# Patient Record
Sex: Female | Born: 1960 | Race: White | Hispanic: No | Marital: Married | Smoking: Never smoker
Health system: Southern US, Community
[De-identification: ages and names within clinical notes are randomized; demographics above are authoritative.]

## PROBLEM LIST (undated history)

## (undated) DIAGNOSIS — E669 Obesity, unspecified: Secondary | ICD-10-CM

## (undated) DIAGNOSIS — R5381 Other malaise: Secondary | ICD-10-CM

## (undated) DIAGNOSIS — J209 Acute bronchitis, unspecified: Secondary | ICD-10-CM

## (undated) DIAGNOSIS — E663 Overweight: Secondary | ICD-10-CM

## (undated) DIAGNOSIS — K5900 Constipation, unspecified: Secondary | ICD-10-CM

## (undated) DIAGNOSIS — N9089 Other specified noninflammatory disorders of vulva and perineum: Secondary | ICD-10-CM

## (undated) DIAGNOSIS — M2550 Pain in unspecified joint: Secondary | ICD-10-CM

## (undated) DIAGNOSIS — R7303 Prediabetes: Secondary | ICD-10-CM

## (undated) DIAGNOSIS — B001 Herpesviral vesicular dermatitis: Secondary | ICD-10-CM

## (undated) DIAGNOSIS — N3000 Acute cystitis without hematuria: Secondary | ICD-10-CM

## (undated) DIAGNOSIS — K2970 Gastritis, unspecified, without bleeding: Secondary | ICD-10-CM

## (undated) DIAGNOSIS — F419 Anxiety disorder, unspecified: Secondary | ICD-10-CM

## (undated) DIAGNOSIS — Z6830 Body mass index (BMI) 30.0-30.9, adult: Secondary | ICD-10-CM

## (undated) HISTORY — DX: Other fatigue: R53.81

## (undated) HISTORY — DX: Other specified noninflammatory disorders of vulva and perineum: N90.89

## (undated) HISTORY — DX: Herpesviral vesicular dermatitis: B00.1

## (undated) HISTORY — DX: Gastritis, unspecified, without bleeding: K29.70

## (undated) HISTORY — DX: Body mass index (BMI) 30.0-30.9, adult: Z68.30

## (undated) HISTORY — PX: BACK SURGERY: SHX140

## (undated) HISTORY — DX: Overweight: E66.3

## (undated) HISTORY — DX: Acute bronchitis, unspecified: J20.9

## (undated) HISTORY — DX: Constipation, unspecified: K59.00

## (undated) HISTORY — PX: OTHER SURGICAL HISTORY: SHX169

## (undated) HISTORY — DX: Prediabetes: R73.03

## (undated) HISTORY — DX: Pain in unspecified joint: M25.50

## (undated) HISTORY — DX: Acute cystitis without hematuria: N30.00

## (undated) HISTORY — DX: Obesity, unspecified: E66.9

## (undated) HISTORY — DX: Anxiety disorder, unspecified: F41.9

---

## 1997-08-12 ENCOUNTER — Other Ambulatory Visit: Admission: RE | Admit: 1997-08-12 | Discharge: 1997-08-12 | Payer: Self-pay | Admitting: Obstetrics & Gynecology

## 1998-10-08 ENCOUNTER — Other Ambulatory Visit: Admission: RE | Admit: 1998-10-08 | Discharge: 1998-10-08 | Payer: Self-pay | Admitting: Obstetrics & Gynecology

## 1999-12-15 ENCOUNTER — Other Ambulatory Visit: Admission: RE | Admit: 1999-12-15 | Discharge: 1999-12-15 | Payer: Self-pay | Admitting: Obstetrics & Gynecology

## 2001-01-03 ENCOUNTER — Other Ambulatory Visit: Admission: RE | Admit: 2001-01-03 | Discharge: 2001-01-03 | Payer: Self-pay | Admitting: Obstetrics & Gynecology

## 2001-04-26 ENCOUNTER — Encounter: Payer: Self-pay | Admitting: Obstetrics & Gynecology

## 2001-04-26 ENCOUNTER — Encounter: Admission: RE | Admit: 2001-04-26 | Discharge: 2001-04-26 | Payer: Self-pay | Admitting: Obstetrics & Gynecology

## 2002-01-10 ENCOUNTER — Other Ambulatory Visit: Admission: RE | Admit: 2002-01-10 | Discharge: 2002-01-10 | Payer: Self-pay | Admitting: Neurology

## 2002-05-08 ENCOUNTER — Encounter: Admission: RE | Admit: 2002-05-08 | Discharge: 2002-05-08 | Payer: Self-pay | Admitting: Obstetrics & Gynecology

## 2002-05-08 ENCOUNTER — Encounter: Payer: Self-pay | Admitting: Obstetrics & Gynecology

## 2002-11-22 ENCOUNTER — Encounter: Payer: Self-pay | Admitting: Neurosurgery

## 2002-11-22 ENCOUNTER — Ambulatory Visit (HOSPITAL_COMMUNITY): Admission: RE | Admit: 2002-11-22 | Discharge: 2002-11-23 | Payer: Self-pay | Admitting: Neurosurgery

## 2003-01-02 ENCOUNTER — Other Ambulatory Visit: Admission: RE | Admit: 2003-01-02 | Discharge: 2003-01-02 | Payer: Self-pay | Admitting: Obstetrics & Gynecology

## 2003-06-13 ENCOUNTER — Encounter: Admission: RE | Admit: 2003-06-13 | Discharge: 2003-06-13 | Payer: Self-pay | Admitting: Obstetrics & Gynecology

## 2004-01-21 ENCOUNTER — Other Ambulatory Visit: Admission: RE | Admit: 2004-01-21 | Discharge: 2004-01-21 | Payer: Self-pay | Admitting: Obstetrics & Gynecology

## 2004-06-15 ENCOUNTER — Encounter: Admission: RE | Admit: 2004-06-15 | Discharge: 2004-06-15 | Payer: Self-pay | Admitting: Obstetrics & Gynecology

## 2005-01-04 ENCOUNTER — Other Ambulatory Visit: Admission: RE | Admit: 2005-01-04 | Discharge: 2005-01-04 | Payer: Self-pay | Admitting: Obstetrics & Gynecology

## 2005-05-30 ENCOUNTER — Encounter: Admission: RE | Admit: 2005-05-30 | Discharge: 2005-05-30 | Payer: Self-pay | Admitting: Obstetrics & Gynecology

## 2006-06-12 ENCOUNTER — Encounter: Admission: RE | Admit: 2006-06-12 | Discharge: 2006-06-12 | Payer: Self-pay | Admitting: Obstetrics & Gynecology

## 2007-06-14 ENCOUNTER — Encounter: Admission: RE | Admit: 2007-06-14 | Discharge: 2007-06-14 | Payer: Self-pay | Admitting: Obstetrics & Gynecology

## 2008-06-17 ENCOUNTER — Encounter: Admission: RE | Admit: 2008-06-17 | Discharge: 2008-06-17 | Payer: Self-pay | Admitting: Obstetrics & Gynecology

## 2009-06-23 ENCOUNTER — Encounter: Admission: RE | Admit: 2009-06-23 | Discharge: 2009-06-23 | Payer: Self-pay | Admitting: Obstetrics & Gynecology

## 2010-05-15 ENCOUNTER — Encounter: Payer: Self-pay | Admitting: Obstetrics & Gynecology

## 2010-07-08 ENCOUNTER — Other Ambulatory Visit: Payer: Self-pay | Admitting: Gastroenterology

## 2010-07-09 ENCOUNTER — Ambulatory Visit
Admission: RE | Admit: 2010-07-09 | Discharge: 2010-07-09 | Disposition: A | Discharge status: Home / Self Care | Payer: 59 | Source: Ambulatory Visit | Attending: Gastroenterology | Admitting: Gastroenterology

## 2010-07-09 MED ORDER — IOHEXOL 300 MG/ML  SOLN
100.0000 mL | Freq: Once | INTRAMUSCULAR | Status: AC | PRN
  Administered 2010-07-09: 100 mL via INTRAVENOUS

## 2010-09-10 NOTE — Op Note (Signed)
Angela Randolph, Angela Randolph                        ACCOUNT NO.:  [id number]   MEDICAL RECORD NO.:  [id number]                   PATIENT TYPE:  OIB   LOCATION:  3172                                 FACILITY:  [facility]   PHYSICIAN:  Danae Orleans. Venetia Maxon, M.D.               DATE OF BIRTH:  [birth year]   DATE OF PROCEDURE:  11/22/2002  DATE OF DISCHARGE:                                 OPERATIVE REPORT   PREOPERATIVE DIAGNOSES:  1. Herniated cervical disk, C6-7.  2. Cervical radiculopathy.  3. Spondylosis.  4. Degenerative disk disease.   POSTOPERATIVE DIAGNOSES:  1. Herniated cervical disk, C6-7.  2. Cervical radiculopathy.  3. Spondylosis.  4. Degenerative disk disease.   PROCEDURES:  1. Anterior cervical decompression and fusion, C6-7 level.  2. Allograft bone graft.  3. Anterior cervical plate.   SURGEON:  Danae Orleans. Venetia Maxon, M.D.   ASSISTANTMarland Kitchen  Mena Goes. Franky Macho, M.D.   ANESTHESIA:  General endotracheal.   ESTIMATED BLOOD LOSS:  Minimal.   COMPLICATIONS:  None.   DISPOSITION:  To recovery room.   INDICATIONS:  Angela Randolph is a 50 year old woman with a profound left C7  radiculopathy with a biforaminal disk herniation at C6-7 causing bilateral  upper extremity pain/weakness.  It was elected to take her to surgery for  anterior cervical decompression and fusion at the C6-7 level.   DESCRIPTION OF PROCEDURE:  Angela Randolph was brought to the operating room.  Following a satisfactory and uncomplicated induction of general endotracheal  anesthesia and placement of intravenous lines, the patient was placed in the  supine position on the operating table.  Her neck was placed in slight  extension.  She was placed in ten pounds of Holter traction.  Her anterior  neck was then prepped and draped in the usual sterile fashion.   The area of planned incision was infiltrated with 0.25% Marcaine and 0.5%  lidocaine with 1:200,000 epinephrine.  The incision was made from midline to  the  anterior border of the sternocleidomastoid muscle in the lower neck  crease.  This was carried sharply through platysmal layers.  Subplatysmal  dissection was then performed, exposing the anterior border of the  sternocleidomastoid muscle.  Using blunt dissection, the carotid sheath was  kept lateral, trachea and esophagus kept medial, and what was felt to be the  C6-7 level was identified.  A bent spinal needle was placed at this level  and then intraoperative x-rays were obtained, which demonstrated the spinal  needle at the C6-7 level.   Subsequently, the longus colli muscles were taken down from the anterior  cervical spine from C6 through C7 levels bilaterally using electrocautery  and Key elevator.  A shadow line self-retaining retractor was placed to  facilitate exposure.  The interspace at C6-7 was then incised with a 15  blade and disk material was removed in a piecemeal fashion.  The endplates  were stripped of residual  disk material using a variety of Carlens curettes.  Disk space spreader was placed and microscope was brought into the field.   Under high microscopic visualization, the endplates of C6 and C7 were  drilled down with an __________ bur and the posterior longitudinal ligament  was then incised and removed in piecemeal fashion.   There was foraminal disk herniation at the C6-7 level on the right with  compression of the right C7 nerve root and this was decompressed.  Subsequently, decompression was performed out the neural foramen on the left  at C6-7 and at the far edge of the canal overlying the C7 nerve root.  Multiple fragments of disk material were removed, which were directly  compressing the C7 nerve root.  This resulted in significant decompression  of the nerve root.  Hemostasis was assured with Gelfoam soaked in Thrombin.   An 8 mm corticocancellous cervical bone graft was then placed after sizing  with sizers.  This was countersunk appropriately.  A 24  mm anterior cervical  Trinica plate was then affixed to the anterior cervical spine using 14 mm  variable angle screws, two at C6 and two at C7.  All screws had excellent  purchase.  Locking mechanisms were engaged.  Final x-ray confirmed position  of bone graft and anterior cervical plate.  The wound was copiously  irrigated with Bacitracin and saline.  Soft tissues were inspected and found  to be in good repair.   The platysmal layer was then closed with 3-0 Vicryl sutures and the skin  edges were reapproximated with running 4-0 Vicryl subcuticular stitch.  The  wound was dressed with Dermabond.   The patient was extubated in the operating room and taken to the recovery  room in stable and satisfactory condition having tolerated the operation.  All counts were correct at the end of the case.                                               Danae Orleans. Venetia Maxon, M.D.    JDS/MEDQ  D:  11/22/2002  T:  11/22/2002  Job:  [id number]

## 2011-01-04 ENCOUNTER — Other Ambulatory Visit: Payer: Self-pay | Admitting: Obstetrics & Gynecology

## 2011-01-04 DIAGNOSIS — Z1231 Encounter for screening mammogram for malignant neoplasm of breast: Secondary | ICD-10-CM

## 2011-01-11 ENCOUNTER — Ambulatory Visit
Admission: RE | Admit: 2011-01-11 | Discharge: 2011-01-11 | Disposition: A | Discharge status: Home / Self Care | Payer: PRIVATE HEALTH INSURANCE | Source: Ambulatory Visit | Attending: Obstetrics & Gynecology | Admitting: Obstetrics & Gynecology

## 2011-01-11 DIAGNOSIS — Z1231 Encounter for screening mammogram for malignant neoplasm of breast: Secondary | ICD-10-CM

## 2012-01-20 ENCOUNTER — Encounter (HOSPITAL_COMMUNITY): Payer: Self-pay | Admitting: *Deleted

## 2012-01-20 ENCOUNTER — Emergency Department (INDEPENDENT_AMBULATORY_CARE_PROVIDER_SITE_OTHER)
Admission: EM | Admit: 2012-01-20 | Discharge: 2012-01-20 | Disposition: A | Discharge status: Home / Self Care | Payer: PRIVATE HEALTH INSURANCE | Source: Home / Self Care | Attending: Family Medicine | Admitting: Family Medicine

## 2012-01-20 DIAGNOSIS — T887XXA Unspecified adverse effect of drug or medicament, initial encounter: Secondary | ICD-10-CM

## 2012-01-20 DIAGNOSIS — T50904A Poisoning by unspecified drugs, medicaments and biological substances, undetermined, initial encounter: Secondary | ICD-10-CM

## 2012-01-20 DIAGNOSIS — I10 Essential (primary) hypertension: Secondary | ICD-10-CM

## 2012-01-20 DIAGNOSIS — L270 Generalized skin eruption due to drugs and medicaments taken internally: Secondary | ICD-10-CM

## 2012-01-20 MED ORDER — HYDROXYZINE HCL 50 MG PO TABS
50.0000 mg | ORAL_TABLET | Freq: Three times a day (TID) | ORAL | Status: DC | PRN
Start: 2012-01-20 — End: 2021-06-15

## 2012-01-20 MED ORDER — PREDNISONE 20 MG PO TABS: ORAL_TABLET | ORAL | Status: DC

## 2012-01-20 MED ORDER — HYDROXYZINE HCL 50 MG PO TABS: 50.0000 mg | ORAL_TABLET | Freq: Three times a day (TID) | ORAL | Status: DC | PRN

## 2012-01-20 MED ORDER — PRAMOXINE HCL 1 % EX LOTN: 1.0000 "application " | TOPICAL_LOTION | Freq: Two times a day (BID) | CUTANEOUS | Status: DC

## 2012-01-20 MED ORDER — PRAMOXINE HCL 1 % EX LOTN
1.0000 | TOPICAL_LOTION | Freq: Two times a day (BID) | CUTANEOUS | Status: DC
Start: 2012-01-20 — End: 2021-06-15

## 2012-01-20 NOTE — ED Notes (Signed)
Pt reports allergic reaction that started on Thursday morning that has gotten worse. Pt has recently been on penicillin derivative , changed last night to doxycycline. Hives all over body and spreading.

## 2012-01-20 NOTE — ED Provider Notes (Signed)
History     CSN: [id number]  Arrival date & time 01/20/12  1018   First MD Initiated Contact with Patient 01/20/12 1031      Chief Complaint  Patient presents with  . Allergic Reaction    (Consider location/radiation/quality/duration/timing/severity/associated sxs/prior treatment) HPI Comments: 51 year old female here complaining of generalized pruriginous rash for 2 days after starting treatment with clindamycin for a tooth abscess. Patient had a reported allergy to penicillin in the past. Rash started after the first dose of clindamycin and worsen after second dose. Last dose of clindamycin was yesterday evening. Rash persistent today minimally improving with Benadryl and hydrocortisone cream. Denies difficulty breathing or difficulty with swallowing. No headache dizziness. No abdominal pain nausea vomiting or diarrhea.  No chest pain or shortest of breath. No face, tongue or leg swelling.   History reviewed. No pertinent past medical history.  Past Surgical History  Procedure Date  . Back surgery   . Cesarean section     Family History  Problem Relation Age of Onset  . Family history unknown: Yes    History  Substance Use Topics  . Smoking status: Never Smoker   . Smokeless tobacco: Not on file  . Alcohol Use: Yes     monthly    OB History    Grav Para Term Preterm Abortions TAB SAB Ect Mult Living                  Review of Systems  Constitutional: Negative for fever, chills, diaphoresis, appetite change and fatigue.  HENT: Negative for congestion, sore throat, rhinorrhea and trouble swallowing.   Eyes: Negative for redness and itching.  Respiratory: Negative for cough, chest tightness, shortness of breath and wheezing.   Gastrointestinal: Negative for nausea, vomiting, abdominal pain and diarrhea.  Genitourinary: Negative for dysuria.  Musculoskeletal: Negative for myalgias and arthralgias.  Skin: Positive for rash.  Neurological: Negative for dizziness and  headaches.    Allergies  Barley grass and Rice  Home Medications   Current Outpatient Rx  Name Route Sig Dispense Refill  . MULTI-VITAMIN/MINERALS PO TABS Oral Take 1 tablet by mouth daily.    Marland Kitchen PAROXETINE HCL 20 MG PO TABS Oral Take 20 mg by mouth every morning.    Marland Kitchen VITAMIN B-12 1000 MCG PO TABS Oral Take 1,000 mcg by mouth daily.    Marland Kitchen HYDROXYZINE HCL 50 MG PO TABS Oral Take 1 tablet (50 mg total) by mouth every 8 (eight) hours as needed for itching. 20 tablet 0  . PRAMOXINE HCL 1 % EX LOTN Topical Apply 1 application topically 2 (two) times daily. 1 Bottle 0  . PREDNISONE 20 MG PO TABS  2 tabs daily for 5 days 10 tablet 0    BP 118/78  Pulse 84  Temp 98.2 F (36.8 C) (Oral)  Resp 18  SpO2 98%  Physical Exam  Nursing note and vitals reviewed. Constitutional: She is oriented to person, place, and time. She appears well-developed and well-nourished. No distress.  HENT:  Head: Normocephalic and atraumatic.  Right Ear: External ear normal.  Left Ear: External ear normal.  Nose: Nose normal.  Mouth/Throat: Oropharynx is clear and moist. No oropharyngeal exudate.       No buccal or pharyngeal edema.   Eyes: Conjunctivae normal and EOM are normal. Pupils are equal, round, and reactive to light. No scleral icterus.  Neck: Neck supple. No JVD present. No thyromegaly present.  Cardiovascular: Normal rate, regular rhythm, normal heart sounds and intact distal  pulses.   Pulmonary/Chest: Effort normal and breath sounds normal. No respiratory distress. She has no wheezes.  Abdominal: Soft. Bowel sounds are normal. She exhibits no distension. There is no tenderness.       No HSM  Lymphadenopathy:    She has no cervical adenopathy.  Neurological: She is alert and oriented to person, place, and time.  Skin:       Fine maculoerythematous and pruriginous rash generalized mo re confluent in face and upper chest. No ulcers, vesicles or pustules. No petechia or other purpuras    ED  Course  Procedures (including critical care time)  Labs Reviewed - No data to display No results found.   1. Allergic drug rash       MDM  Typical drug related rash. Prescribe prednisone, hydroxyzine and pramoxine lotion. Encouraged to avoid re\re exposure. Asked to return to medical attention if new or worsening symptomssymptoms like difficulty breathing or swallowing despite following treatment.        Sharin Grave, MD 01/22/12 [phone]

## 2012-02-03 ENCOUNTER — Other Ambulatory Visit: Payer: Self-pay | Admitting: Obstetrics & Gynecology

## 2012-02-03 DIAGNOSIS — Z1231 Encounter for screening mammogram for malignant neoplasm of breast: Secondary | ICD-10-CM

## 2012-02-17 ENCOUNTER — Ambulatory Visit
Admission: RE | Admit: 2012-02-17 | Discharge: 2012-02-17 | Disposition: A | Discharge status: Home / Self Care | Payer: PRIVATE HEALTH INSURANCE | Source: Ambulatory Visit | Attending: Obstetrics & Gynecology | Admitting: Obstetrics & Gynecology

## 2012-02-17 DIAGNOSIS — Z1231 Encounter for screening mammogram for malignant neoplasm of breast: Secondary | ICD-10-CM

## 2013-02-20 ENCOUNTER — Other Ambulatory Visit: Payer: Self-pay

## 2013-02-20 DIAGNOSIS — Z1231 Encounter for screening mammogram for malignant neoplasm of breast: Secondary | ICD-10-CM

## 2013-03-26 ENCOUNTER — Ambulatory Visit
Admission: RE | Admit: 2013-03-26 | Discharge: 2013-03-26 | Disposition: A | Discharge status: Home / Self Care | Payer: PRIVATE HEALTH INSURANCE | Source: Ambulatory Visit

## 2013-03-26 DIAGNOSIS — Z1231 Encounter for screening mammogram for malignant neoplasm of breast: Secondary | ICD-10-CM

## 2013-12-26 ENCOUNTER — Other Ambulatory Visit: Payer: Self-pay

## 2013-12-26 DIAGNOSIS — Z1231 Encounter for screening mammogram for malignant neoplasm of breast: Secondary | ICD-10-CM

## 2014-03-28 ENCOUNTER — Ambulatory Visit
Admission: RE | Admit: 2014-03-28 | Discharge: 2014-03-28 | Disposition: A | Discharge status: Home / Self Care | Payer: PRIVATE HEALTH INSURANCE | Source: Ambulatory Visit

## 2014-03-28 ENCOUNTER — Encounter (INDEPENDENT_AMBULATORY_CARE_PROVIDER_SITE_OTHER): Payer: Self-pay

## 2014-03-28 DIAGNOSIS — Z1231 Encounter for screening mammogram for malignant neoplasm of breast: Secondary | ICD-10-CM

## 2014-11-10 ENCOUNTER — Other Ambulatory Visit: Payer: Self-pay

## 2014-11-10 DIAGNOSIS — Z1231 Encounter for screening mammogram for malignant neoplasm of breast: Secondary | ICD-10-CM

## 2015-03-31 ENCOUNTER — Ambulatory Visit
Admission: RE | Admit: 2015-03-31 | Discharge: 2015-03-31 | Disposition: A | Discharge status: Home / Self Care | Payer: PRIVATE HEALTH INSURANCE | Source: Ambulatory Visit

## 2015-03-31 DIAGNOSIS — Z1231 Encounter for screening mammogram for malignant neoplasm of breast: Secondary | ICD-10-CM

## 2015-04-21 ENCOUNTER — Other Ambulatory Visit: Payer: Self-pay | Admitting: *Deleted

## 2015-04-21 DIAGNOSIS — R200 Anesthesia of skin: Secondary | ICD-10-CM

## 2015-05-12 ENCOUNTER — Ambulatory Visit (INDEPENDENT_AMBULATORY_CARE_PROVIDER_SITE_OTHER): Payer: PRIVATE HEALTH INSURANCE | Admitting: Neurology

## 2015-05-12 DIAGNOSIS — R200 Anesthesia of skin: Secondary | ICD-10-CM | POA: Diagnosis not present

## 2015-05-12 DIAGNOSIS — G5603 Carpal tunnel syndrome, bilateral upper limbs: Secondary | ICD-10-CM

## 2015-05-12 DIAGNOSIS — M5412 Radiculopathy, cervical region: Secondary | ICD-10-CM

## 2015-05-12 NOTE — Procedures (Addendum)
[facility]  [address] [place], [address]  [place], [place] [address] Tel: [phone] Fax:  [phone] Test Date:  05/12/2015  Patient: Angela Randolph DOB: 08/10/1960 Physician: Nita Sickle, DO  Sex: Female Height:  Ref Phys: Maeola Harman  ID#: [id number]   Technician:    Patient Complaints: This is a 55 year-old female referred for evaluation of hand paresthesias, especially over the first three digits and worse on the right.  NCV & EMG Findings: Extensive electrodiagnostic testing of the right upper extremity and additional studies of the left shows:  1. Bilateral median sensory responses show prolonged distal peak latency (R5.4, L6.0 ms) and reduced amplitude (R6.6, L10.2 V).  Bilateral ulnar sensory responses are within normal limits. 2. Bilateral median motor responses show prolonged latency (R5.7, L5.0 ms).  Bilateral ulnar motor responses are within normal limits. 3. 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  There is no evidence of accompanied active denervation.  Impression: 1. Bilateral median neuropathy at or distal to the wrist, consistent with a clinical diagnosis of carpal tunnel syndrome.  Overall, these findings are severe in degree electrically. 2. Chronic C6 radiculopathy affecting the left upper extremity, mild.   ___________________________ Nita Sickle, DO    Nerve Conduction Studies Anti Sensory Summary Table   Site NR Peak (ms) Norm Peak (ms) P-T Amp (V) Norm P-T Amp  Left Median Anti Sensory (2nd Digit)  Wrist    6.0 <3.6 10.2 >15  Right Median Anti Sensory (2nd Digit)  Wrist    5.4 <3.6 6.6 >15  Left Ulnar Anti Sensory (5th Digit)  Wrist    2.5 <3.1 20.3 >10  Right Ulnar Anti Sensory (5th Digit)  Wrist    2.4 <3.1 17.5 >10   Motor Summary Table   Site NR Onset (ms) Norm Onset (ms) O-P Amp (mV) Norm O-P Amp Site1 Site2 Delta-0 (ms) Dist (cm) Vel (m/s) Norm Vel  (m/s)  Left Median Motor (Abd Poll Brev)  Wrist    5.0 <4.0 7.7 >6 Elbow Wrist 4.8 0.0  >50  Elbow    9.8  8.1  Ulnar crossover Elbow 5.4 0.0    Ulnar crossover    4.4  9.9         Right Median Motor (Abd Poll Brev)  Wrist    5.7 <4.0 10.8 >6 Elbow Wrist 5.0 27.0 54 >50  Elbow    10.7  10.8         Left Ulnar Motor (Abd Dig Minimi)  Wrist    2.4 <3.1 11.6 >7 B Elbow Wrist 3.4 23.0 68 >50  B Elbow    5.8  10.8  A Elbow B Elbow 1.6 10.0 62 >50  A Elbow    7.4  10.2         Right Ulnar Motor (Abd Dig Minimi)  Wrist    2.4 <3.1 10.6 >7 B Elbow Wrist 3.5 23.0 66 >50  B Elbow    5.9  10.5  A Elbow B Elbow 1.6 10.0 63 >50  A Elbow    7.5  10.2          EMG   Side Muscle Ins Act Fibs Psw Fasc Number Recrt Dur Dur. Amp Amp. Poly Poly. Comment  Right 1stDorInt Nml Nml Nml Nml Nml Nml Nml Nml Nml Nml Nml Nml N/A  Right Abd Poll Brev Nml Nml Nml Nml 1- Rapid Few 1+ Few 1+ Nml Nml  N/A  Right Ext Indicis Nml Nml Nml Nml Nml Nml Nml Nml Nml Nml Nml Nml N/A  Right PronatorTeres Nml Nml Nml Nml Nml Nml Nml Nml Nml Nml Nml Nml N/A  Right Biceps Nml Nml Nml Nml Nml Nml Nml Nml Nml Nml Nml Nml N/A  Right Triceps Nml Nml Nml Nml Nml Nml Nml Nml Nml Nml Nml Nml N/A  Right Deltoid Nml Nml Nml Nml Nml Nml Nml Nml Nml Nml Nml Nml N/A  Left 1stDorInt Nml Nml Nml Nml Nml Nml Nml Nml Nml Nml Nml Nml N/A  Left Abd Poll Brev Nml Nml Nml Nml Nml Nml Nml Nml Nml Nml Nml Nml N/A  Left Ext Indicis Nml Nml Nml Nml Nml Nml Nml Nml Nml Nml Nml Nml N/A  Left PronatorTeres Nml Nml Nml Nml 1- Rapid Some 1+ Some 1+ Nml Nml N/A  Left Biceps Nml Nml Nml Nml 1- Rapid Some 1+ Some 1+ Nml Nml N/A  Left Triceps Nml Nml Nml Nml Nml Nml Nml Nml Nml Nml Nml Nml N/A  Left Deltoid Nml Nml Nml Nml Nml Nml Nml Nml Nml Nml Nml Nml N/A      Waveforms:

## 2016-01-26 ENCOUNTER — Other Ambulatory Visit: Payer: Self-pay | Admitting: Obstetrics & Gynecology

## 2016-01-26 DIAGNOSIS — Z1231 Encounter for screening mammogram for malignant neoplasm of breast: Secondary | ICD-10-CM

## 2016-03-30 ENCOUNTER — Ambulatory Visit
Admission: RE | Admit: 2016-03-30 | Discharge: 2016-03-30 | Disposition: A | Discharge status: Home / Self Care | Payer: PRIVATE HEALTH INSURANCE | Source: Ambulatory Visit | Attending: Obstetrics & Gynecology | Admitting: Obstetrics & Gynecology

## 2016-03-30 DIAGNOSIS — Z1231 Encounter for screening mammogram for malignant neoplasm of breast: Secondary | ICD-10-CM

## 2017-02-08 ENCOUNTER — Other Ambulatory Visit: Payer: Self-pay | Admitting: Obstetrics & Gynecology

## 2017-02-08 DIAGNOSIS — Z1231 Encounter for screening mammogram for malignant neoplasm of breast: Secondary | ICD-10-CM

## 2017-04-03 ENCOUNTER — Ambulatory Visit: Payer: PRIVATE HEALTH INSURANCE

## 2017-05-11 ENCOUNTER — Ambulatory Visit
Admission: RE | Admit: 2017-05-11 | Discharge: 2017-05-11 | Disposition: A | Discharge status: Home / Self Care | Payer: PRIVATE HEALTH INSURANCE | Source: Ambulatory Visit | Attending: Obstetrics & Gynecology | Admitting: Obstetrics & Gynecology

## 2017-05-11 DIAGNOSIS — Z1231 Encounter for screening mammogram for malignant neoplasm of breast: Secondary | ICD-10-CM

## 2017-09-15 ENCOUNTER — Ambulatory Visit (INDEPENDENT_AMBULATORY_CARE_PROVIDER_SITE_OTHER): Payer: PRIVATE HEALTH INSURANCE | Admitting: Ophthalmology

## 2017-09-15 ENCOUNTER — Encounter (INDEPENDENT_AMBULATORY_CARE_PROVIDER_SITE_OTHER): Payer: Self-pay | Admitting: Ophthalmology

## 2017-09-15 DIAGNOSIS — H25813 Combined forms of age-related cataract, bilateral: Secondary | ICD-10-CM | POA: Diagnosis not present

## 2017-09-15 DIAGNOSIS — H33011 Retinal detachment with single break, right eye: Secondary | ICD-10-CM

## 2017-09-15 DIAGNOSIS — H3581 Retinal edema: Secondary | ICD-10-CM | POA: Diagnosis not present

## 2017-09-15 MED ORDER — PREDNISOLONE ACETATE 1 % OP SUSP: 1.0000 [drp] | Freq: Four times a day (QID) | OPHTHALMIC | 0 refills | Status: AC

## 2017-09-15 NOTE — Progress Notes (Signed)
[facility] - Clinic Note  09/15/2017     CHIEF COMPLAINT Patient presents for Retina Evaluation   HISTORY OF PRESENT ILLNESS: Angela Randolph is a 57 y.o. female who presents to the clinic today for:   HPI    Retina Evaluation    In both eyes.  This started 1 day ago.  Associated Symptoms Negative for Floaters, Blind Spot, Glare, Shoulder/Hip pain, Fatigue, Jaw Claudication, Photophobia, Distortion, Redness, Scalp Tenderness, Weight Loss, Fever, Trauma, Pain and Flashes.  Context:  distance vision, mid-range vision and near vision.  Treatments tried include no treatments.  I, the attending physician,  performed the HPI with the patient and updated documentation appropriately.          Comments    Referral of DR. Groat for retina eval./possible tear OD. Patient states she had eye exam done yesterday By DR. Groat who dx her with retina tear OD, pt denies floaters , flashes and ocular pain. Pt reports she had floaters appx one year ago but does not have them any longer.Pt takes multivitamins QD.Denies gtt's       Last edited by Rennis Chris, MD on 09/15/2017  1:28 PM. 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 Laruth Bouchard office a year ago and saw Dr. 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 Dione Booze said he does not think that is the correct diagnosis, pt saw Groat for routine eye exam, was told her optic nerve was enlarged as well as retinal hole OD,  Referring physician: Olivia Canter, MD [address] [place], [place] [address]  HISTORICAL INFORMATION:   Selected notes from the MEDICAL RECORD NUMBER Referred by Dr. Fabian Sharp for concern of retinal hole LEE: 05.23.19 (S. Groat) [BCVA: OD: 20/20 OS: 20/20] Ocular Hx-Retinoschisis OD, PVD OD, Cataract OU PMH-    CURRENT MEDICATIONS: Current Outpatient Medications (Ophthalmic Drugs)  Medication Sig  . prednisoLONE  acetate (PRED FORTE) 1 % ophthalmic suspension Place 1 drop into the right eye 4 (four) times daily for 7 days.   No current facility-administered medications for this visit.  (Ophthalmic Drugs)   Current Outpatient Medications (Other)  Medication Sig  . DULoxetine (CYMBALTA) 30 MG capsule Take 30 mg by mouth daily.  . hydrOXYzine (ATARAX/VISTARIL) 50 MG tablet Take 1 tablet (50 mg total) by mouth every 8 (eight) hours as needed for itching.  . Multiple Vitamins-Minerals (MULTIVITAMIN WITH MINERALS) tablet Take 1 tablet by mouth daily.  . pramoxine (SARNA SENSITIVE) 1 % LOTN Apply 1 application topically 2 (two) times daily.  . SUPER B COMPLEX/C PO Take by mouth.  Marland Kitchen PARoxetine (PAXIL) 20 MG tablet Take 20 mg by mouth every morning.  . predniSONE (DELTASONE) 20 MG tablet 2 tabs daily for 5 days (Patient not taking: Reported on 09/15/2017)  . vitamin B-12 (CYANOCOBALAMIN) 1000 MCG tablet Take 1,000 mcg by mouth daily.   No current facility-administered medications for this visit.  (Other)      REVIEW OF SYSTEMS: ROS    Positive for: Musculoskeletal, Eyes   Negative for: Constitutional, Gastrointestinal, Neurological, Skin, Genitourinary, HENT, Endocrine, Cardiovascular, Respiratory, Psychiatric, Allergic/Imm, Heme/Lymph   Last edited by Eldridge Scot, LPN on 09/27/5407 12:59 PM. (History)       ALLERGIES Allergies  Allergen Reactions  . Barley Grass   . Clindamycin/Lincomycin   . Rice     PAST MEDICAL HISTORY History  reviewed. No pertinent past medical history. Past Surgical History:  Procedure Laterality Date  . BACK SURGERY    . CESAREAN SECTION      FAMILY HISTORY Family History  Problem Relation Age of Onset  . Diabetes Mother   . Stroke Mother   . Hypertension Mother   . Atrial fibrillation Brother   . Diabetes Maternal Grandfather   . Heart disease Maternal Grandfather   . Hypertension Maternal Grandfather     SOCIAL HISTORY Social History   Tobacco  Use  . Smoking status: Never Smoker  . Smokeless tobacco: Never Used  Substance Use Topics  . Alcohol use: Yes    Comment: monthly  . Drug use: No         OPHTHALMIC EXAM:  Base Eye Exam    Visual Acuity (Snellen - Linear)      Right Left   Dist cc 20/20 20/20   Correction:  Glasses       Tonometry (Tonopen, 1:14 PM)      Right Left   Pressure 8 10       Pupils      Dark Light Shape React APD   Right 4 3 Round Brisk None   Left 4 3 Round Brisk None       Visual Fields (Counting fingers)      Left Right    Full Full       Extraocular Movement      Right Left    Full, Ortho Full, Ortho       Neuro/Psych    Oriented x3:  Yes   Mood/Affect:  Normal       Dilation    Both eyes:  1.0% Mydriacyl, 2.5% Phenylephrine @ 1:12 PM        Slit Lamp and Fundus Exam    Slit Lamp Exam      Right Left   Lids/Lashes Dermatochalasis  Dermatochalasis    Conjunctiva/Sclera White and quiet White and quiet   Cornea mild arcus mild arcus   Anterior Chamber deep , No cell or flare deep , No cell or flare   Iris Round and dilated Round and dilated   Lens 2+ Cortical cataract, 2+ Nuclear sclerosis 2+ Cortical cataract, 2+ Nuclear sclerosis   Vitreous Vitreous syneresis, Posterior vitreous detachment Vitreous syneresis       Fundus Exam      Right Left   Disc Pink and Sharp Pink and Sharp   C/D Ratio 0.6 0.5   Macula Flat, focal area of RPE disruption inferotemporal fovea Flat, Good foveal reflex, mild RPe mottling, no heme or edema   Vessels Normal, mild attenuation Normal, mild attenuation   Periphery linear break at 0900 with surrounding SRF / focal RD, vitreous condensations inferotemporal Attached        Refraction    Wearing Rx      Sphere Cylinder Axis Add   Right +0.50 Sphere  +2.00   Left +1.00 +0.50 010 +2.00   Age:  14yr   Type:  PAL          IMAGING AND PROCEDURES  Imaging and Procedures for @  OCT, Retina - OU - Both Eyes       Right  Eye Central Foveal Thickness: 293. Progression has no prior data (Trace ERM, focal RPE disruption Temporal fovea). Findings include normal foveal contour, no IRF, no SRF.   Left Eye Central Foveal Thickness: 269. Progression has no prior data. Findings include normal foveal contour, no  SRF, no IRF (Trace ERM).   Notes *Images captured and stored on drive  Diagnosis / Impression:   NFP, NO IRF/SRF OU   Clinical management:  See below  Abbreviations: NFP - Normal foveal profile. CME - cystoid macular edema. PED - pigment epithelial detachment. IRF - intraretinal fluid. SRF - subretinal fluid. EZ - ellipsoid zone. ERM - epiretinal membrane. ORA - outer retinal atrophy. ORT - outer retinal tubulation. SRHM - subretinal hyper-reflective material        Repair Retinal Detach, Photocoag - OD - Right Eye       LASER PROCEDURE NOTE  Procedure:  Barrier laser retinopexy using slit lamp laser, RIGHT eye   Diagnosis:   Retinal tear with +SRF / focal retinal detachment, RIGHT eye                     9 o'clock anterior to equator   Surgeon: Rennis Chris, MD, PhD  Anesthesia: Topical  Informed consent obtained, operative eye marked, and time out performed prior to initiation of laser.   Laser settings:  Lumenis Smart532 laser, slit lamp Lens: Mainster PRP 165 Power: 300 mW Spot size: 200 microns Duration: 50 msec  # spots: 300  Placement of laser: Using a Mainster PRP 165 contact lens at the slit lamp, laser was placed in three confluent rows around retinal tear and surrounding focal retinal detachment at 0930. Additional anterior laser to ora was placed using indirect ophthalmoscope. 347 spots at 250 mW, 70 ms duration.  Complications: None.  Patient tolerated the procedure well and received written and verbal post-procedure care information/education.                  ASSESSMENT/PLAN:    ICD-10-CM   1. 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 Retinal edema H35.81 OCT, Retina - OU - Both Eyes  3. 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 Linear retinal break with surrounding SRF / focal retinal detachment OD - asymptomatic - linear break at 0900 with surrounding shallow SRF - discussed findings, prognosis and treatment options - recommend laser retinopexy OD - RBA of procedure discussed, questions answered - informed consent obtained and signed - see procedure note - start PF QID OD x7 days - f/u in 1 wk  2. No retinal edema on exam or OCT  3. Combined form of age-related cataract OU - The symptoms of cataract, surgical options, and treatments and risks were discussed with patient. - discussed diagnosis and progression - not yet visually significant - monitor for now   Ophthalmic Meds Ordered this visit:  Meds ordered this encounter  Medications  . prednisoLONE acetate (PRED FORTE) 1 % ophthalmic suspension    Sig: Place 1 drop into the right eye 4 (four) times daily for 7 days.    Dispense:  10 mL    Refill:  0       Return in about 1 week (around 09/22/2017) for POV.  There are no Patient Instructions on file for this visit.   Explained the diagnoses, plan, and follow up with the patient and they expressed understanding.  Patient expressed understanding of the importance of proper follow up care.   This document serves as a record of services personally performed by Karie Chimera, MD, PhD. It was created on their behalf by Laurian Brim, OA, an ophthalmic assistant. The creation of  this record is the provider's dictation and/or activities during the visit.    Electronically signed by: Laurian Brim, OA  05.24.2019 2:55 PM    Karie Chimera, M.D., Ph.D. Diseases & Surgery of the Retina and Vitreous [facility]   I have reviewed the above documentation for accuracy and completeness, and I agree with the  above. Karie Chimera, M.D., Ph.D. 09/15/17 2:55 PM       Abbreviations: M myopia (nearsighted); A astigmatism; H hyperopia (farsighted); P presbyopia; Mrx spectacle prescription;  CTL contact lenses; OD right eye; OS left eye; OU both eyes  XT exotropia; ET esotropia; PEK punctate epithelial keratitis; PEE punctate epithelial erosions; DES dry eye syndrome; MGD meibomian gland dysfunction; ATs artificial tears; PFAT's preservative free artificial tears; NSC nuclear sclerotic cataract; PSC posterior subcapsular cataract; ERM epi-retinal membrane; PVD posterior vitreous detachment; RD retinal detachment; DM diabetes mellitus; DR diabetic retinopathy; NPDR non-proliferative diabetic retinopathy; PDR proliferative diabetic retinopathy; CSME clinically significant macular edema; DME diabetic macular edema; dbh dot blot hemorrhages; CWS cotton wool spot; POAG primary open angle glaucoma; C/D cup-to-disc ratio; HVF humphrey visual field; GVF goldmann visual field; OCT optical coherence tomography; IOP intraocular pressure; BRVO Branch retinal vein occlusion; CRVO central retinal vein occlusion; CRAO central retinal artery occlusion; BRAO branch retinal artery occlusion; RT retinal tear; SB scleral buckle; PPV pars plana vitrectomy; VH Vitreous hemorrhage; PRP panretinal laser photocoagulation; IVK intravitreal kenalog; VMT vitreomacular traction; MH Macular hole;  NVD neovascularization of the disc; NVE neovascularization elsewhere; AREDS age related eye disease study; ARMD age related macular degeneration; POAG primary open angle glaucoma; EBMD epithelial/anterior basement membrane dystrophy; ACIOL anterior chamber intraocular lens; IOL intraocular lens; PCIOL posterior chamber intraocular lens; Phaco/IOL phacoemulsification with intraocular lens placement; PRK photorefractive keratectomy; LASIK laser assisted in situ keratomileusis; HTN hypertension; DM diabetes mellitus; COPD chronic obstructive pulmonary  disease

## 2017-09-21 NOTE — Progress Notes (Signed)
[facility] - Clinic Note  09/22/2017     CHIEF COMPLAINT Patient presents for Retina Follow Up   HISTORY OF PRESENT ILLNESS: Angela Randolph is a 57 y.o. female who presents to the clinic today for:   HPI    Retina Follow Up    Patient presents with  Retinal Break/Detachment.  In right eye.  This started 1 week ago.  Severity is mild.  Since onset it is stable.  I, the attending physician,  performed the HPI with the patient and updated documentation appropriately.          Comments    POV Laser retinopexy OD (09/15/17). Patient states she noticed yesterday after noon a small yellow circle (8 or 9 O'clock position)  in her visual field then again this am, it lasted about a second.Patient will complete her PFOD QID today as instructed.         Last edited by Rennis Chris, MD on 09/22/2017 12:18 PM. 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 Laruth Bouchard office a year ago and saw Dr. 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 Dione Booze said he does not think that is the correct diagnosis, pt saw Groat for routine eye exam, was told her optic nerve was enlarged as well as retinal hole OD,  Referring physician: Annamaria Helling, MD [address] [place], [place] [address]  HISTORICAL INFORMATION:   Selected notes from the MEDICAL RECORD NUMBER Referred by Dr. Fabian Sharp for concern of retinal hole LEE: 05.23.19 (S. Groat) [BCVA: OD: 20/20 OS: 20/20] Ocular Hx-Retinoschisis OD, PVD OD, Cataract OU PMH-    CURRENT MEDICATIONS: Current Outpatient Medications (Ophthalmic Drugs)  Medication Sig  . prednisoLONE acetate (PRED FORTE) 1 % ophthalmic suspension Place 1 drop into the right eye 4 (four) times daily for 7 days.   No current facility-administered medications for this visit.  (Ophthalmic Drugs)   Current Outpatient Medications (Other)  Medication Sig  . DULoxetine  (CYMBALTA) 30 MG capsule Take 30 mg by mouth daily.  . hydrOXYzine (ATARAX/VISTARIL) 50 MG tablet Take 1 tablet (50 mg total) by mouth every 8 (eight) hours as needed for itching.  . Multiple Vitamins-Minerals (MULTIVITAMIN WITH MINERALS) tablet Take 1 tablet by mouth daily.  Marland Kitchen PARoxetine (PAXIL) 20 MG tablet Take 20 mg by mouth every morning.  . pramoxine (SARNA SENSITIVE) 1 % LOTN Apply 1 application topically 2 (two) times daily.  . predniSONE (DELTASONE) 20 MG tablet 2 tabs daily for 5 days  . SUPER B COMPLEX/C PO Take by mouth.  . vitamin B-12 (CYANOCOBALAMIN) 1000 MCG tablet Take 1,000 mcg by mouth daily.   No current facility-administered medications for this visit.  (Other)      REVIEW OF SYSTEMS: ROS    Positive for: Eyes   Negative for: Constitutional, Gastrointestinal, Neurological, Skin, Genitourinary, Musculoskeletal, HENT, Endocrine, Cardiovascular, Respiratory, Psychiatric, Allergic/Imm, Heme/Lymph   Last edited by Concepcion Elk, COA on 09/22/2017 12:00 PM. (History)       ALLERGIES Allergies  Allergen Reactions  . Barley Grass   . Clindamycin/Lincomycin   . Rice     PAST MEDICAL HISTORY History reviewed. No pertinent past medical history. Past Surgical History:  Procedure Laterality Date  . BACK SURGERY    . CESAREAN SECTION      FAMILY HISTORY Family History  Problem Relation Age of Onset  .  Diabetes Mother   . Stroke Mother   . Hypertension Mother   . Atrial fibrillation Brother   . Diabetes Maternal Grandfather   . Heart disease Maternal Grandfather   . Hypertension Maternal Grandfather     SOCIAL HISTORY Social History   Tobacco Use  . Smoking status: Never Smoker  . Smokeless tobacco: Never Used  Substance Use Topics  . Alcohol use: Yes    Comment: monthly  . Drug use: No         OPHTHALMIC EXAM:  Base Eye Exam    Visual Acuity (Snellen - Linear)      Right Left   Dist cc 20/25 +2 20/20   Dist ph cc 20/20 NI    Correction:  Glasses       Tonometry (Tonopen, 11:56 AM)      Right Left   Pressure 17 18       Pupils      Dark Light Shape React APD   Right 4 3 Round Brisk None   Left 4 3 Round Brisk None       Visual Fields (Counting fingers)      Left Right    Full Full       Extraocular Movement      Right Left    Full, Ortho Full, Ortho       Neuro/Psych    Oriented x3:  Yes   Mood/Affect:  Normal       Dilation    Both eyes:  1.0% Mydriacyl, Paremyd @ 11:56 AM        Slit Lamp and Fundus Exam    Slit Lamp Exam      Right Left   Lids/Lashes Dermatochalasis  Dermatochalasis    Conjunctiva/Sclera White and quiet White and quiet   Cornea mild arcus mild arcus   Anterior Chamber deep , No cell or flare deep , No cell or flare   Iris Round and dilated Round and dilated   Lens 2+ Cortical cataract, 2+ Nuclear sclerosis 2+ Cortical cataract, 2+ Nuclear sclerosis   Vitreous Vitreous syneresis, Posterior vitreous detachment Vitreous syneresis       Fundus Exam      Right Left   Disc Pink and Sharp Pink and Sharp   C/D Ratio 0.6 0.5   Macula Flat, focal area of RPE disruption inferotemporal fovea Flat, Good foveal reflex, mild RPe mottling, no heme or edema   Vessels Normal, mild attenuation Normal, mild attenuation   Periphery linear break at 0900 with surrounding SRF / focal RD -- good early laser changes, vitreous condensations inferotemporal Attached          IMAGING AND PROCEDURES  Imaging and Procedures for @           ASSESSMENT/PLAN:    ICD-10-CM   1. 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 Retinal edema H35.81   3. 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 Linear retinal break with surrounding SRF / focal retinal detachment OD - asymptomatic - linear break at 0900 with surrounding shallow SRF - S/P laser retinopexy OD (05.24.19) -- good early laser changes present - finishing PF QID OD x7 days -  f/u wk of June 10  2. No retinal edema on exam or OCT  3. Combined form of age-related cataract OU - The symptoms of cataract, surgical options, and treatments and risks were discussed with patient. - discussed diagnosis and progression -  not yet visually significant - monitor for now   Ophthalmic Meds Ordered this visit:  No orders of the defined types were placed in this encounter.      Return for week of June 10th.  There are no Patient Instructions on file for this visit.   Explained the diagnoses, plan, and follow up with the patient and they expressed understanding.  Patient expressed understanding of the importance of proper follow up care.   This document serves as a record of services personally performed by Karie Chimera, MD, PhD. It was created on their behalf by Virgilio Belling, COA, a certified ophthalmic assistant. The creation of this record is the provider's dictation and/or activities during the visit.  Electronically signed by: Virgilio Belling, COA  05.30.19 12:30 PM    Karie Chimera, M.D., Ph.D. Diseases & Surgery of the Retina and Vitreous [facility] 05.31.19  I have reviewed the above documentation for accuracy and completeness, and I agree with the above. Karie Chimera, M.D., Ph.D. 09/22/17 12:30 PM     Abbreviations: M myopia (nearsighted); A astigmatism; H hyperopia (farsighted); P presbyopia; Mrx spectacle prescription;  CTL contact lenses; OD right eye; OS left eye; OU both eyes  XT exotropia; ET esotropia; PEK punctate epithelial keratitis; PEE punctate epithelial erosions; DES dry eye syndrome; MGD meibomian gland dysfunction; ATs artificial tears; PFAT's preservative free artificial tears; NSC nuclear sclerotic cataract; PSC posterior subcapsular cataract; ERM epi-retinal membrane; PVD posterior vitreous detachment; RD retinal detachment; DM diabetes mellitus; DR diabetic retinopathy; NPDR non-proliferative diabetic  retinopathy; PDR proliferative diabetic retinopathy; CSME clinically significant macular edema; DME diabetic macular edema; dbh dot blot hemorrhages; CWS cotton wool spot; POAG primary open angle glaucoma; C/D cup-to-disc ratio; HVF humphrey visual field; GVF goldmann visual field; OCT optical coherence tomography; IOP intraocular pressure; BRVO Branch retinal vein occlusion; CRVO central retinal vein occlusion; CRAO central retinal artery occlusion; BRAO branch retinal artery occlusion; RT retinal tear; SB scleral buckle; PPV pars plana vitrectomy; VH Vitreous hemorrhage; PRP panretinal laser photocoagulation; IVK intravitreal kenalog; VMT vitreomacular traction; MH Macular hole;  NVD neovascularization of the disc; NVE neovascularization elsewhere; AREDS age related eye disease study; ARMD age related macular degeneration; POAG primary open angle glaucoma; EBMD epithelial/anterior basement membrane dystrophy; ACIOL anterior chamber intraocular lens; IOL intraocular lens; PCIOL posterior chamber intraocular lens; Phaco/IOL phacoemulsification with intraocular lens placement; PRK photorefractive keratectomy; LASIK laser assisted in situ keratomileusis; HTN hypertension; DM diabetes mellitus; COPD chronic obstructive pulmonary disease

## 2017-09-22 ENCOUNTER — Ambulatory Visit (INDEPENDENT_AMBULATORY_CARE_PROVIDER_SITE_OTHER): Payer: PRIVATE HEALTH INSURANCE | Admitting: Ophthalmology

## 2017-09-22 ENCOUNTER — Encounter (INDEPENDENT_AMBULATORY_CARE_PROVIDER_SITE_OTHER): Payer: Self-pay | Admitting: Ophthalmology

## 2017-09-22 DIAGNOSIS — H3581 Retinal edema: Secondary | ICD-10-CM

## 2017-09-22 DIAGNOSIS — H33011 Retinal detachment with single break, right eye: Secondary | ICD-10-CM

## 2017-09-22 DIAGNOSIS — H25813 Combined forms of age-related cataract, bilateral: Secondary | ICD-10-CM

## 2017-10-02 ENCOUNTER — Encounter (INDEPENDENT_AMBULATORY_CARE_PROVIDER_SITE_OTHER): Payer: PRIVATE HEALTH INSURANCE | Admitting: Ophthalmology

## 2017-10-02 NOTE — Progress Notes (Signed)
[facility] - Clinic Note  10/03/2017     CHIEF COMPLAINT Patient presents for Post-op Follow-up   HISTORY OF PRESENT ILLNESS: Angela Randolph is a 57 y.o. female who presents to the clinic today for:   HPI    Post-op Follow-up    In right eye.  Discomfort includes floaters.  Negative for pain, itching, foreign body sensation, tearing and discharge.  Vision is stable.  I, the attending physician,  performed the HPI with the patient and updated documentation appropriately.          Comments    Pt presents for s/p laser retinopexy OD, pt states VA has been stable since last visit, states she is seeing floaters OD, but they go away, denies flashes, pain or wavy vision, pt states she is having headaches, but that may be because of the weather, pt denies the use of gtts,        Last edited by Rennis Chris, MD on 10/03/2017  3:48 PM. (History)      Referring physician: Annamaria Helling, MD [address] [place], [place] [address]  HISTORICAL INFORMATION:   Selected notes from the MEDICAL RECORD NUMBER Referred by Dr. Fabian Sharp for concern of retinal hole LEE: 05.23.19 (S. Groat) [BCVA: OD: 20/20 OS: 20/20] Ocular Hx-Retinoschisis OD, PVD OD, Cataract OU PMH-    CURRENT MEDICATIONS: No current outpatient medications on file. (Ophthalmic Drugs)   No current facility-administered medications for this visit.  (Ophthalmic Drugs)   Current Outpatient Medications (Other)  Medication Sig  . DULoxetine (CYMBALTA) 30 MG capsule Take 30 mg by mouth daily.  . hydrOXYzine (ATARAX/VISTARIL) 50 MG tablet Take 1 tablet (50 mg total) by mouth every 8 (eight) hours as needed for itching.  . meloxicam (MOBIC) 15 MG tablet   . Multiple Vitamins-Minerals (MULTIVITAMIN WITH MINERALS) tablet Take 1 tablet by mouth daily.  Marland Kitchen PARoxetine (PAXIL) 20 MG tablet Take 20 mg by mouth every morning.  . pramoxine (SARNA SENSITIVE) 1 % LOTN Apply 1 application topically 2  (two) times daily.  . predniSONE (DELTASONE) 20 MG tablet 2 tabs daily for 5 days  . SUPER B COMPLEX/C PO Take by mouth.  . vitamin B-12 (CYANOCOBALAMIN) 1000 MCG tablet Take 1,000 mcg by mouth daily.   No current facility-administered medications for this visit.  (Other)      REVIEW OF SYSTEMS: ROS    Negative for: Constitutional, Gastrointestinal, Neurological, Skin, Genitourinary, Musculoskeletal, HENT, Endocrine, Cardiovascular, Eyes, Respiratory, Psychiatric, Allergic/Imm, Heme/Lymph   Last edited by Posey Boyer, COT on 10/03/2017  3:00 PM. (History)       ALLERGIES Allergies  Allergen Reactions  . Barley Grass   . Clindamycin/Lincomycin   . Rice     PAST MEDICAL HISTORY History reviewed. No pertinent past medical history. Past Surgical History:  Procedure Laterality Date  . BACK SURGERY    . CESAREAN SECTION      FAMILY HISTORY Family History  Problem Relation Age of Onset  . Diabetes Mother   . Stroke Mother   . Hypertension Mother   . Atrial fibrillation Brother   . Diabetes Maternal Grandfather   . Heart disease Maternal Grandfather   . Hypertension Maternal Grandfather     SOCIAL HISTORY Social History   Tobacco Use  . Smoking status: Never Smoker  . Smokeless tobacco: Never Used  Substance Use Topics  . Alcohol use: Yes    Comment: monthly  . Drug use: No  OPHTHALMIC EXAM:  Base Eye Exam    Visual Acuity (Snellen - Linear)      Right Left   Dist cc 20/25 +2 20/20   Dist ph cc NI    Correction:  Glasses       Tonometry (Tonopen, 3:04 PM)      Right Left   Pressure 13 14       Pupils      Dark Light Shape React APD   Right 4 2 Round Brisk None   Left 4 2 Round Brisk None       Visual Fields (Counting fingers)      Left Right    Full Full       Extraocular Movement      Right Left    Full, Ortho Full, Ortho       Neuro/Psych    Oriented x3:  Yes   Mood/Affect:  Normal       Dilation    Right eye:  1.0%  Mydriacyl, 2.5% Phenylephrine @ 3:04 PM        Slit Lamp and Fundus Exam    Slit Lamp Exam      Right Left   Lids/Lashes Dermatochalasis  Dermatochalasis    Conjunctiva/Sclera White and quiet White and quiet   Cornea mild arcus mild arcus   Anterior Chamber deep , No cell or flare deep , No cell or flare   Iris Round and dilated Round and dilated   Lens 2+ Cortical cataract, 2+ Nuclear sclerosis 2+ Cortical cataract, 2+ Nuclear sclerosis   Vitreous Vitreous syneresis, Posterior vitreous detachment, vitreous condensations inferotemporal Vitreous syneresis       Fundus Exam      Right Left   Disc Pink and Sharp Pink and Sharp   C/D Ratio 0.6 0.5   Macula Flat, focal area of RPE disruption inferotemporal fovea Flat, Good foveal reflex, mild RPe mottling, no heme or edema   Vessels Normal, mild attenuation Normal, mild attenuation   Periphery linear break at 0900 with surrounding SRF / focal RD -- good laser surrounding Attached          IMAGING AND PROCEDURES  Imaging and Procedures for @TODAY @  OCT, Retina - OU - Both Eyes       Right Eye Quality was good. Central Foveal Thickness: 303. Progression has been stable (Trace ERM, focal RPE disruption Temporal fovea). Findings include normal foveal contour, no IRF, no SRF.   Left Eye Quality was good. Central Foveal Thickness: 296. Progression has been stable. Findings include normal foveal contour, no SRF, no IRF (Trace ERM).   Notes *Images captured and stored on drive  Diagnosis / Impression:  NFP, No IRF/SRF OU   Clinical management:  See below  Abbreviations: NFP - Normal foveal profile. CME - cystoid macular edema. PED - pigment epithelial detachment. IRF - intraretinal fluid. SRF - subretinal fluid. EZ - ellipsoid zone. ERM - epiretinal membrane. ORA - outer retinal atrophy. ORT - outer retinal tubulation. SRHM - subretinal hyper-reflective material                 ASSESSMENT/PLAN:    ICD-10-CM   1.  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 Retinal edema H35.81 OCT, Retina - OU - Both Eyes  3. 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 Linear retinal break with surrounding SRF / focal retinal detachment OD - asymptomatic - linear break at 0900 with  surrounding shallow SRF - S/P laser retinopexy OD (05.24.19) -- good laser surrounding - finished PF QID OD x7 days - f/u 6-8 weeks  2. No retinal edema on exam or OCT  3. Combined form of age-related cataract OU - The symptoms of cataract, surgical options, and treatments and risks were discussed with patient. - discussed diagnosis and progression - not yet visually significant - monitor for now   Ophthalmic Meds Ordered this visit:  No orders of the defined types were placed in this encounter.      Return in about 8 weeks (around 11/28/2017) for F/u laser retinopexy OD, DFE, OCT.  There are no Patient Instructions on file for this visit.   Explained the diagnoses, plan, and follow up with the patient and they expressed understanding.  Patient expressed understanding of the importance of proper follow up care.   This document serves as a record of services personally performed by Karie ChimeraBrian G. Leshawn Houseworth, MD, PhD. It was created on their behalf by Laurian BrimAmanda Brown, OA, an ophthalmic assistant. The creation of this record is the provider's dictation and/or activities during the visit.    Electronically signed by: Laurian BrimAmanda Brown, OA  06.10.2019 9:55 PM    Karie ChimeraBrian G. Erma Raiche, M.D., Ph.D. Diseases & Surgery of the Retina and Vitreous [facility]  I have reviewed the above documentation for accuracy and completeness, and I agree with the above. Karie ChimeraBrian G. Riti Rollyson, M.D., Ph.D. 10/05/17 9:58 PM     Abbreviations: M myopia (nearsighted); A astigmatism; H hyperopia (farsighted); P presbyopia; Mrx spectacle prescription;  CTL contact lenses; OD right eye; OS left eye; OU both eyes  XT  exotropia; ET esotropia; PEK punctate epithelial keratitis; PEE punctate epithelial erosions; DES dry eye syndrome; MGD meibomian gland dysfunction; ATs artificial tears; PFAT's preservative free artificial tears; NSC nuclear sclerotic cataract; PSC posterior subcapsular cataract; ERM epi-retinal membrane; PVD posterior vitreous detachment; RD retinal detachment; DM diabetes mellitus; DR diabetic retinopathy; NPDR non-proliferative diabetic retinopathy; PDR proliferative diabetic retinopathy; CSME clinically significant macular edema; DME diabetic macular edema; dbh dot blot hemorrhages; CWS cotton wool spot; POAG primary open angle glaucoma; C/D cup-to-disc ratio; HVF humphrey visual field; GVF goldmann visual field; OCT optical coherence tomography; IOP intraocular pressure; BRVO Branch retinal vein occlusion; CRVO central retinal vein occlusion; CRAO central retinal artery occlusion; BRAO branch retinal artery occlusion; RT retinal tear; SB scleral buckle; PPV pars plana vitrectomy; VH Vitreous hemorrhage; PRP panretinal laser photocoagulation; IVK intravitreal kenalog; VMT vitreomacular traction; MH Macular hole;  NVD neovascularization of the disc; NVE neovascularization elsewhere; AREDS age related eye disease study; ARMD age related macular degeneration; POAG primary open angle glaucoma; EBMD epithelial/anterior basement membrane dystrophy; ACIOL anterior chamber intraocular lens; IOL intraocular lens; PCIOL posterior chamber intraocular lens; Phaco/IOL phacoemulsification with intraocular lens placement; PRK photorefractive keratectomy; LASIK laser assisted in situ keratomileusis; HTN hypertension; DM diabetes mellitus; COPD chronic obstructive pulmonary disease

## 2017-10-03 ENCOUNTER — Encounter (INDEPENDENT_AMBULATORY_CARE_PROVIDER_SITE_OTHER): Payer: Self-pay | Admitting: Ophthalmology

## 2017-10-03 ENCOUNTER — Ambulatory Visit (INDEPENDENT_AMBULATORY_CARE_PROVIDER_SITE_OTHER): Payer: PRIVATE HEALTH INSURANCE | Admitting: Ophthalmology

## 2017-10-03 DIAGNOSIS — H3581 Retinal edema: Secondary | ICD-10-CM

## 2017-10-03 DIAGNOSIS — H25813 Combined forms of age-related cataract, bilateral: Secondary | ICD-10-CM

## 2017-10-03 DIAGNOSIS — H33011 Retinal detachment with single break, right eye: Secondary | ICD-10-CM

## 2017-10-05 ENCOUNTER — Encounter (INDEPENDENT_AMBULATORY_CARE_PROVIDER_SITE_OTHER): Payer: Self-pay | Admitting: Ophthalmology

## 2017-11-20 NOTE — Progress Notes (Signed)
[facility] Clinic Note  11/21/2017     CHIEF COMPLAINT Patient presents for Retina Follow Up   HISTORY OF PRESENT ILLNESS: Angela Randolph is a 57 y.o. female who presents to the clinic today for:   HPI    Retina Follow Up    Patient presents with  Retinal Break/Detachment.  In right eye.  Severity is mild.  Since onset it is stable.  I, the attending physician,  performed the HPI with the patient and updated documentation appropriately.          Comments    F/U RD OD S/P laser retinopexy (09/15/17). Patient states she continues to have occasional floaters OD, denies new visual onsets.       Last edited by Bernarda Caffey, MD on 11/21/2017  3:20 PM. (History)      Referring physician: Vania Rea, MD [address] [place], [place] [address]  HISTORICAL INFORMATION:   Selected notes from the MEDICAL RECORD NUMBER Referred by Dr. Wyatt Portela for concern of retinal hole LEE: 05.23.19 (S. Groat) [BCVA: OD: 20/20 OS: 20/20] Ocular Hx-Retinoschisis OD, PVD OD, Cataract OU PMH-    CURRENT MEDICATIONS: No current outpatient medications on file. (Ophthalmic Drugs)   No current facility-administered medications for this visit.  (Ophthalmic Drugs)   Current Outpatient Medications (Other)  Medication Sig  . DULoxetine (CYMBALTA) 30 MG capsule Take 30 mg by mouth daily.  . hydrOXYzine (ATARAX/VISTARIL) 50 MG tablet Take 1 tablet (50 mg total) by mouth every 8 (eight) hours as needed for itching.  . meloxicam (MOBIC) 15 MG tablet   . Multiple Vitamins-Minerals (MULTIVITAMIN WITH MINERALS) tablet Take 1 tablet by mouth daily.  Marland Kitchen PARoxetine (PAXIL) 20 MG tablet Take 20 mg by mouth every morning.  . pramoxine (SARNA SENSITIVE) 1 % LOTN Apply 1 application topically 2 (two) times daily.  . predniSONE (DELTASONE) 20 MG tablet 2 tabs daily for 5 days  . SUPER B COMPLEX/C PO Take by mouth.  . vitamin B-12 (CYANOCOBALAMIN) 1000 MCG tablet Take 1,000  mcg by mouth daily.   No current facility-administered medications for this visit.  (Other)      REVIEW OF SYSTEMS: ROS    Positive for: Eyes   Negative for: Constitutional, Gastrointestinal, Neurological, Skin, Genitourinary, Musculoskeletal, HENT, Endocrine, Cardiovascular, Respiratory, Psychiatric, Allergic/Imm, Heme/Lymph   Last edited by Zenovia Jordan, LPN on 08/26/5463  6:81 PM. (History)       ALLERGIES Allergies  Allergen Reactions  . Barley Grass   . Clindamycin/Lincomycin   . Rice     PAST MEDICAL HISTORY History reviewed. No pertinent past medical history. Past Surgical History:  Procedure Laterality Date  . BACK SURGERY    . CESAREAN SECTION      FAMILY HISTORY Family History  Problem Relation Age of Onset  . Diabetes Mother   . Stroke Mother   . Hypertension Mother   . Atrial fibrillation Brother   . Diabetes Maternal Grandfather   . Heart disease Maternal Grandfather   . Hypertension Maternal Grandfather     SOCIAL HISTORY Social History   Tobacco Use  . Smoking status: Never Smoker  . Smokeless tobacco: Never Used  Substance Use Topics  . Alcohol use: Yes    Comment: monthly  . Drug use: No         OPHTHALMIC EXAM:  Base Eye Exam    Visual Acuity (Snellen - Linear)      Right Left   Dist  cc 20/25 -1 20/20 -1   Dist ph cc NI 20/20       Tonometry (Tonopen, 3:10 PM)      Right Left   Pressure 13 15       Pupils      Dark Light Shape React APD   Right 4 2 Round Brisk None   Left 4 2 Round Brisk None       Visual Fields      Left Right    Full Full       Neuro/Psych    Oriented x3:  Yes   Mood/Affect:  Normal       Dilation    Right eye:  1.0% Mydriacyl, 2.5% Phenylephrine @ 3:10 PM        Slit Lamp and Fundus Exam    Slit Lamp Exam      Right Left   Lids/Lashes Dermatochalasis  Dermatochalasis    Conjunctiva/Sclera White and quiet White and quiet   Cornea mild arcus mild arcus   Anterior Chamber deep ,  No cell or flare deep , No cell or flare   Iris Round and dilated Round and dilated   Lens 2+ Cortical cataract, 2+ Nuclear sclerosis 2+ Cortical cataract, 2+ Nuclear sclerosis   Vitreous Vitreous syneresis, Posterior vitreous detachment, vitreous condensations inferotemporal Vitreous syneresis       Fundus Exam      Right Left   Disc Pink and Sharp Pink and Sharp   C/D Ratio 0.6 0.5   Macula Flat, focal area of RPE disruption inferotemporal fovea, Retinal pigment epithelial mottling Flat, Good foveal reflex, mild RPe mottling, no heme or edema   Vessels mild attenuation mild attenuation   Periphery linear break at 0900 with improved SRF -- good laser surrounding Attached          IMAGING AND PROCEDURES  Imaging and Procedures for @TODAY @  OCT, Retina - OU - Both Eyes       Right Eye Quality was good. Central Foveal Thickness: 303. Progression has been stable. Findings include normal foveal contour, no IRF, no SRF (Trace ERM, focal RPE disruption Temporal fovea).   Left Eye Quality was good. Central Foveal Thickness: 296. Progression has been stable. Findings include normal foveal contour, no SRF, no IRF (Trace ERM).   Notes *Images captured and stored on drive  Diagnosis / Impression:  NFP, No IRF/SRF OU   Clinical management:  See below  Abbreviations: NFP - Normal foveal profile. CME - cystoid macular edema. PED - pigment epithelial detachment. IRF - intraretinal fluid. SRF - subretinal fluid. EZ - ellipsoid zone. ERM - epiretinal membrane. ORA - outer retinal atrophy. ORT - outer retinal tubulation. SRHM - subretinal hyper-reflective material                 ASSESSMENT/PLAN:    ICD-10-CM   1. 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 Retinal edema H35.81 OCT, Retina - OU - Both Eyes  3. 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 Linear retinal break with surrounding SRF / focal retinal detachment OD -  asymptomatic - linear break at 0900 with surrounding shallow SRF - S/P laser retinopexy OD (05.24.19) -- good laser surrounding and SRF improved - finished PF QID OD x7 days - f/u 3 months  2. No retinal edema on exam or OCT  3. Combined form of age-related cataract OU - The symptoms of cataract, surgical options, and treatments  and risks were discussed with patient. - discussed diagnosis and progression - not yet visually significant - monitor for now   Ophthalmic Meds Ordered this visit:  No orders of the defined types were placed in this encounter.      Return in about 3 months (around 02/21/2018) for F/U laser ret OD, DFE OU, OCT.  There are no Patient Instructions on file for this visit.   Explained the diagnoses, plan, and follow up with the patient and they expressed understanding.  Patient expressed understanding of the importance of proper follow up care.   This document serves as a record of services personally performed by Gardiner Sleeper, MD, PhD. It was created on their behalf by Ernest Mallick, OA, an ophthalmic assistant. The creation of this record is the provider's dictation and/or activities during the visit.    Electronically signed by: Ernest Mallick, OA  07.29.2019 11:37 PM    Gardiner Sleeper, M.D., Ph.D. Diseases & Surgery of the Retina and [place] 11/21/17  I have reviewed the above documentation for accuracy and completeness, and I agree with the above. Gardiner Sleeper, M.D., Ph.D. 11/21/17 11:37 PM   Abbreviations: M myopia (nearsighted); A astigmatism; H hyperopia (farsighted); P presbyopia; Mrx spectacle prescription;  CTL contact lenses; OD right eye; OS left eye; OU both eyes  XT exotropia; ET esotropia; PEK punctate epithelial keratitis; PEE punctate epithelial erosions; DES dry eye syndrome; MGD meibomian gland dysfunction; ATs artificial tears; PFAT's preservative free artificial tears; [place] nuclear sclerotic  cataract; PSC posterior subcapsular cataract; ERM epi-retinal membrane; PVD posterior vitreous detachment; RD retinal detachment; DM diabetes mellitus; DR diabetic retinopathy; NPDR non-proliferative diabetic retinopathy; PDR proliferative diabetic retinopathy; CSME clinically significant macular edema; DME diabetic macular edema; dbh dot blot hemorrhages; CWS cotton wool spot; POAG primary open angle glaucoma; C/D cup-to-disc ratio; HVF humphrey visual field; GVF goldmann visual field; OCT optical coherence tomography; IOP intraocular pressure; BRVO Branch retinal vein occlusion; CRVO central retinal vein occlusion; CRAO central retinal artery occlusion; BRAO branch retinal artery occlusion; RT retinal tear; SB scleral buckle; PPV pars plana vitrectomy; VH Vitreous hemorrhage; PRP panretinal laser photocoagulation; IVK intravitreal kenalog; VMT vitreomacular traction; MH Macular hole;  NVD neovascularization of the disc; NVE neovascularization elsewhere; AREDS age related eye disease study; ARMD age related macular degeneration; POAG primary open angle glaucoma; EBMD epithelial/anterior basement membrane dystrophy; ACIOL anterior chamber intraocular lens; IOL intraocular lens; PCIOL posterior chamber intraocular lens; Phaco/IOL phacoemulsification with intraocular lens placement; [place] photorefractive keratectomy; LASIK laser assisted in situ keratomileusis; HTN hypertension; DM diabetes mellitus; COPD chronic obstructive pulmonary disease

## 2017-11-21 ENCOUNTER — Ambulatory Visit (INDEPENDENT_AMBULATORY_CARE_PROVIDER_SITE_OTHER): Payer: PRIVATE HEALTH INSURANCE | Admitting: Ophthalmology

## 2017-11-21 ENCOUNTER — Encounter (INDEPENDENT_AMBULATORY_CARE_PROVIDER_SITE_OTHER): Payer: Self-pay | Admitting: Ophthalmology

## 2017-11-21 DIAGNOSIS — H3581 Retinal edema: Secondary | ICD-10-CM | POA: Diagnosis not present

## 2017-11-21 DIAGNOSIS — H25813 Combined forms of age-related cataract, bilateral: Secondary | ICD-10-CM

## 2017-11-21 DIAGNOSIS — H33011 Retinal detachment with single break, right eye: Secondary | ICD-10-CM

## 2018-02-16 NOTE — Progress Notes (Signed)
[facility] - Clinic Note  02/19/2018     CHIEF COMPLAINT Patient presents for Retina Follow Up   HISTORY OF PRESENT ILLNESS: Angela Randolph is a 57 y.o. female who presents to the clinic today for:   HPI    Retina Follow Up    Patient presents with  Retinal Break/Detachment.  In right eye.  Severity is mild.  Since onset it is stable.  I, the attending physician,  performed the HPI with the patient and updated documentation appropriately.          Comments    F/U laser retinopexy OD(09/15/17). Patient states her vision is good"occasional floaters" OD. Denies new visual issues/onsets.        Last edited by Rennis Chris, MD on 02/19/2018  3:51 PM. (History)    pt states she has a few floaters every now and then, but nothing new  Referring physician: Annamaria Helling, MD [address] [place], [place] [address]  HISTORICAL INFORMATION:   Selected notes from the MEDICAL RECORD NUMBER Referred by Dr. Fabian Sharp for concern of retinal hole LEE: 05.23.19 (S. Groat) [BCVA: OD: 20/20 OS: 20/20] Ocular Hx-Retinoschisis OD, PVD OD, Cataract OU PMH-    CURRENT MEDICATIONS: No current outpatient medications on file. (Ophthalmic Drugs)   No current facility-administered medications for this visit.  (Ophthalmic Drugs)   Current Outpatient Medications (Other)  Medication Sig  . DULoxetine (CYMBALTA) 30 MG capsule Take 30 mg by mouth daily.  . hydrOXYzine (ATARAX/VISTARIL) 50 MG tablet Take 1 tablet (50 mg total) by mouth every 8 (eight) hours as needed for itching.  . meloxicam (MOBIC) 15 MG tablet   . Multiple Vitamins-Minerals (MULTIVITAMIN WITH MINERALS) tablet Take 1 tablet by mouth daily.  Marland Kitchen PARoxetine (PAXIL) 20 MG tablet Take 20 mg by mouth every morning.  . pramoxine (SARNA SENSITIVE) 1 % LOTN Apply 1 application topically 2 (two) times daily.  . predniSONE (DELTASONE) 20 MG tablet 2 tabs daily for 5 days  . SUPER B COMPLEX/C PO Take by  mouth.  . vitamin B-12 (CYANOCOBALAMIN) 1000 MCG tablet Take 1,000 mcg by mouth daily.   No current facility-administered medications for this visit.  (Other)      REVIEW OF SYSTEMS: ROS    Positive for: Eyes   Negative for: Constitutional, Gastrointestinal, Neurological, Skin, Genitourinary, Musculoskeletal, HENT, Endocrine, Cardiovascular, Respiratory, Psychiatric, Allergic/Imm, Heme/Lymph   Last edited by Eldridge Scot, LPN on 40/98/1191  3:17 PM. (History)       ALLERGIES Allergies  Allergen Reactions  . Barley Grass   . Clindamycin/Lincomycin   . Rice     PAST MEDICAL HISTORY History reviewed. No pertinent past medical history. Past Surgical History:  Procedure Laterality Date  . BACK SURGERY    . CESAREAN SECTION      FAMILY HISTORY Family History  Problem Relation Age of Onset  . Diabetes Mother   . Stroke Mother   . Hypertension Mother   . Atrial fibrillation Brother   . Diabetes Maternal Grandfather   . Heart disease Maternal Grandfather   . Hypertension Maternal Grandfather     SOCIAL HISTORY Social History   Tobacco Use  . Smoking status: Never Smoker  . Smokeless tobacco: Never Used  Substance Use Topics  . Alcohol use: Yes    Comment: monthly  . Drug use: No         OPHTHALMIC EXAM:  Base Eye Exam    Visual Acuity (Snellen - Linear)  Right Left   Dist cc 20/25 -1 20/20 -1   Dist ph cc NI NI   Correction:  Glasses       Tonometry (Tonopen, 3:25 PM)      Right Left   Pressure 14 16       Pupils      Dark Light Shape React APD   Right 4 3 Round Brisk None   Left 4 3 Round Brisk None       Visual Fields      Left Right    Full Full       Extraocular Movement      Right Left    Full, Ortho Full, Ortho       Neuro/Psych    Oriented x3:  Yes   Mood/Affect:  Normal       Dilation    Both eyes:  1.0% Mydriacyl, 2.5% Phenylephrine @ 3:26 PM        Slit Lamp and Fundus Exam    Slit Lamp Exam      Right  Left   Lids/Lashes Dermatochalasis  Dermatochalasis    Conjunctiva/Sclera White and quiet White and quiet   Cornea mild arcus mild arcus, 1+ Punctate epithelial erosions   Anterior Chamber Deep and quiet Deep and quiet   Iris Round and dilated Round and dilated   Lens 2+ Cortical cataract, 2+ Nuclear sclerosis 2+ Cortical cataract, 2+ Nuclear sclerosis   Vitreous Vitreous syneresis, mild asteroid, Posterior vitreous detachment, vitreous condensations inferotemporal Vitreous syneresis       Fundus Exam      Right Left   Disc Pink and Sharp Pink and Sharp   C/D Ratio 0.6 0.5   Macula Flat, focal area of RPE disruption inferotemporal fovea, Retinal pigment epithelial mottling, No heme or edema Flat, Good foveal reflex, mild RPE mottling, no heme or edema, scattered Drusen   Vessels mild attenuation, mild Tortuousity mild attenuation, Copper wiring   Periphery Attached, linear break at 0900 with good laser surrounding -- no SRF Attached          IMAGING AND PROCEDURES  Imaging and Procedures for @TODAY @  OCT, Retina - OU - Both Eyes       Right Eye Quality was good. Central Foveal Thickness: 304. Progression has been stable. Findings include normal foveal contour, no IRF, no SRF, retinal drusen  (Trace ERM, focal RPE disruption Temporal fovea).   Left Eye Quality was good. Central Foveal Thickness: 302. Progression has been stable. Findings include normal foveal contour, no SRF, no IRF (Trace ERM).   Notes *Images captured and stored on drive  Diagnosis / Impression:  NFP, No IRF/SRF OU   Clinical management:  See below  Abbreviations: NFP - Normal foveal profile. CME - cystoid macular edema. PED - pigment epithelial detachment. IRF - intraretinal fluid. SRF - subretinal fluid. EZ - ellipsoid zone. ERM - epiretinal membrane. ORA - outer retinal atrophy. ORT - outer retinal tubulation. SRHM - subretinal hyper-reflective material                 ASSESSMENT/PLAN:     ICD-10-CM   1. 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 Retinal edema H35.81 OCT, Retina - OU - Both Eyes  3. 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 Linear retinal break with surrounding SRF / focal retinal detachment OD - asymptomatic - linear break at 0900 with surrounding shallow SRF -- SRF resolved -  S/P laser retinopexy OD (05.24.19) -- good laser surrounding break - no new retinal tear or RD - will release from our retinal care, back to Dr. Dione Booze for general eye care - f/u here PRN  2. No retinal edema on exam or OCT  3. Combined form of age-related cataract OU - The symptoms of cataract, surgical options, and treatments and risks were discussed with patient. - discussed diagnosis and progression - not yet visually significant - monitor for now   Ophthalmic Meds Ordered this visit:  No orders of the defined types were placed in this encounter.      Return if symptoms worsen or fail to improve.  There are no Patient Instructions on file for this visit.   Explained the diagnoses, plan, and follow up with the patient and they expressed understanding.  Patient expressed understanding of the importance of proper follow up care.   This document serves as a record of services personally performed by Karie Chimera, MD, PhD. It was created on their behalf by Laurian Brim, OA, an ophthalmic assistant. The creation of this record is the provider's dictation and/or activities during the visit.    Electronically signed by: Laurian Brim, OA  10.25.19 11:20 PM     Karie Chimera, M.D., Ph.D. Diseases & Surgery of the Retina and Vitreous [facility]   I have reviewed the above documentation for accuracy and completeness, and I agree with the above. Karie Chimera, M.D., Ph.D. 02/20/18 11:20 PM    Abbreviations: M myopia (nearsighted); A astigmatism; H hyperopia (farsighted); P presbyopia; Mrx spectacle  prescription;  CTL contact lenses; OD right eye; OS left eye; OU both eyes  XT exotropia; ET esotropia; PEK punctate epithelial keratitis; PEE punctate epithelial erosions; DES dry eye syndrome; MGD meibomian gland dysfunction; ATs artificial tears; PFAT's preservative free artificial tears; NSC nuclear sclerotic cataract; PSC posterior subcapsular cataract; ERM epi-retinal membrane; PVD posterior vitreous detachment; RD retinal detachment; DM diabetes mellitus; DR diabetic retinopathy; NPDR non-proliferative diabetic retinopathy; PDR proliferative diabetic retinopathy; CSME clinically significant macular edema; DME diabetic macular edema; dbh dot blot hemorrhages; CWS cotton wool spot; POAG primary open angle glaucoma; C/D cup-to-disc ratio; HVF humphrey visual field; GVF goldmann visual field; OCT optical coherence tomography; IOP intraocular pressure; BRVO Branch retinal vein occlusion; CRVO central retinal vein occlusion; CRAO central retinal artery occlusion; BRAO branch retinal artery occlusion; RT retinal tear; SB scleral buckle; PPV pars plana vitrectomy; VH Vitreous hemorrhage; PRP panretinal laser photocoagulation; IVK intravitreal kenalog; VMT vitreomacular traction; MH Macular hole;  NVD neovascularization of the disc; NVE neovascularization elsewhere; AREDS age related eye disease study; ARMD age related macular degeneration; POAG primary open angle glaucoma; EBMD epithelial/anterior basement membrane dystrophy; ACIOL anterior chamber intraocular lens; IOL intraocular lens; PCIOL posterior chamber intraocular lens; Phaco/IOL phacoemulsification with intraocular lens placement; PRK photorefractive keratectomy; LASIK laser assisted in situ keratomileusis; HTN hypertension; DM diabetes mellitus; COPD chronic obstructive pulmonary disease

## 2018-02-19 ENCOUNTER — Encounter (INDEPENDENT_AMBULATORY_CARE_PROVIDER_SITE_OTHER): Payer: Self-pay | Admitting: Ophthalmology

## 2018-02-19 ENCOUNTER — Ambulatory Visit (INDEPENDENT_AMBULATORY_CARE_PROVIDER_SITE_OTHER): Payer: PRIVATE HEALTH INSURANCE | Admitting: Ophthalmology

## 2018-02-19 DIAGNOSIS — H3581 Retinal edema: Secondary | ICD-10-CM

## 2018-02-19 DIAGNOSIS — H33011 Retinal detachment with single break, right eye: Secondary | ICD-10-CM

## 2018-02-19 DIAGNOSIS — H25813 Combined forms of age-related cataract, bilateral: Secondary | ICD-10-CM

## 2018-02-20 ENCOUNTER — Encounter (INDEPENDENT_AMBULATORY_CARE_PROVIDER_SITE_OTHER): Payer: Self-pay | Admitting: Ophthalmology

## 2018-03-09 ENCOUNTER — Other Ambulatory Visit: Payer: Self-pay | Admitting: Obstetrics & Gynecology

## 2018-03-09 DIAGNOSIS — Z1231 Encounter for screening mammogram for malignant neoplasm of breast: Secondary | ICD-10-CM

## 2018-05-14 ENCOUNTER — Ambulatory Visit
Admission: RE | Admit: 2018-05-14 | Discharge: 2018-05-14 | Disposition: A | Discharge status: Home / Self Care | Payer: PRIVATE HEALTH INSURANCE | Source: Ambulatory Visit | Attending: Obstetrics & Gynecology | Admitting: Obstetrics & Gynecology

## 2018-05-14 DIAGNOSIS — Z1231 Encounter for screening mammogram for malignant neoplasm of breast: Secondary | ICD-10-CM

## 2018-05-15 ENCOUNTER — Ambulatory Visit: Payer: PRIVATE HEALTH INSURANCE

## 2018-07-12 ENCOUNTER — Other Ambulatory Visit: Payer: Self-pay | Admitting: *Deleted

## 2018-07-12 ENCOUNTER — Encounter: Payer: Self-pay | Admitting: Neurology

## 2018-07-12 DIAGNOSIS — G5603 Carpal tunnel syndrome, bilateral upper limbs: Secondary | ICD-10-CM

## 2018-07-31 ENCOUNTER — Encounter: Payer: PRIVATE HEALTH INSURANCE | Admitting: Neurology

## 2018-08-07 ENCOUNTER — Encounter: Payer: PRIVATE HEALTH INSURANCE | Admitting: Neurology

## 2018-09-12 ENCOUNTER — Encounter: Payer: Self-pay | Admitting: Neurology

## 2018-10-11 ENCOUNTER — Ambulatory Visit: Payer: PRIVATE HEALTH INSURANCE | Admitting: Neurology

## 2018-10-11 ENCOUNTER — Encounter: Payer: PRIVATE HEALTH INSURANCE | Admitting: Neurology

## 2018-10-11 ENCOUNTER — Other Ambulatory Visit: Payer: Self-pay

## 2018-10-11 DIAGNOSIS — G5603 Carpal tunnel syndrome, bilateral upper limbs: Secondary | ICD-10-CM

## 2018-10-11 NOTE — Procedures (Signed)
[facility]  [address], [address]  [place], [place] [address] Tel: [phone]) [phone] Fax:  [phone]) [phone] Test Date:  10/11/2018  Patient: Angela QuantCynthia Schafer DOB: [birth year] Physician: Nita Sickleonika Patel, DO  Sex: Female Height: 5\' 5"  Ref Phys: Maeola HarmanJoseph Stern, MD  ID#: [id number] Temp: 35.0C Technician:    Patient Complaints: This is a 58 year old female referred for evaluation of bilateral hand paresthesias.  NCV & EMG Findings: Extensive electrodiagnostic testing of the right upper extremity and additional studies of the left shows:  1. Bilateral median sensory responses show prolonged distal peak latency (R3.9, L3.7 ms) and reduced amplitude (R10.1, L10.5 V).  Bilateral ulnar sensory responses are within normal limits.   2. Bilateral median and ulnar motor responses are within normal limits.   3. Sparse chronic motor axonal loss changes are seen affecting bilateral abductor pollicis brevis muscles, without accompanied active denervation.    Impression: Bilateral median neuropathy at or distal to the wrist (moderate), consistent with a clinical diagnosis of carpal tunnel syndrome.  Compared to prior study dated 05/12/2015, there has been interval improvement.   ___________________________ Nita Sickleonika Patel, DO    Nerve Conduction Studies Anti Sensory Summary Table   Site NR Peak (ms) Norm Peak (ms) P-T Amp (V) Norm P-T Amp  Left Median Anti Sensory (2nd Digit)  35C  Wrist    3.7 <3.6 10.5 >15  Right Median Anti Sensory (2nd Digit)  35C  Wrist    3.9 <3.6 10.1 >15  Left Ulnar Anti Sensory (5th Digit)  35C  Wrist    2.7 <3.1 16.8 >10  Right Ulnar Anti Sensory (5th Digit)  35C  Wrist    2.5 <3.1 15.8 >10   Motor Summary Table   Site NR Onset (ms) Norm Onset (ms) O-P Amp (mV) Norm O-P Amp Site1 Site2 Delta-0 (ms) Dist (cm) Vel (m/s) Norm Vel (m/s)  Left Median Motor (Abd Poll Brev)  35C  Wrist    3.4 <4.0 10.8 >6 Elbow Wrist 4.9 28.0 57 >50  Elbow    8.3  10.4          Right Median Motor (Abd Poll Brev)  35C  Wrist    3.5 <4.0 8.8 >6 Elbow Wrist 4.2 28.0 67 >50  Elbow    7.7  8.3         Left Ulnar Motor (Abd Dig Minimi)  35C  Wrist    2.0 <3.1 11.8 >7 B Elbow Wrist 3.6 25.0 69 >50  B Elbow    5.6  10.3  A Elbow B Elbow 1.6 10.0 62 >50  A Elbow    7.2  10.1         Right Ulnar Motor (Abd Dig Minimi)  35C  Wrist    2.3 <3.1 10.3 >7 B Elbow Wrist 3.5 23.0 66 >50  B Elbow    5.8  10.1  A Elbow B Elbow 1.6 10.0 62 >50  A Elbow    7.4  10.0          EMG   Side Muscle Ins Act Fibs Psw Fasc Number Recrt Dur Dur. Amp Amp. Poly Poly. Comment  Right 1stDorInt Nml Nml Nml Nml Nml Nml Nml Nml Nml Nml Nml Nml N/A  Right Abd Poll Brev Nml Nml Nml Nml 1- Rapid Some 1+ Some 1+ Some 1+ N/A  Right PronatorTeres Nml Nml Nml Nml Nml Nml Nml Nml Nml Nml Nml Nml N/A  Right Biceps Nml Nml Nml Nml Nml Nml Nml Nml Nml  Nml Nml Nml N/A  Right Triceps Nml Nml Nml Nml Nml Nml Nml Nml Nml Nml Nml Nml N/A  Right Deltoid Nml Nml Nml Nml Nml Nml Nml Nml Nml Nml Nml Nml N/A  Left 1stDorInt Nml Nml Nml Nml Nml Nml Nml Nml Nml Nml Nml Nml N/A  Left Abd Poll Brev Nml Nml Nml Nml 1- Rapid Some 1+ Some 1+ Some 1+ N/A  Left PronatorTeres Nml Nml Nml Nml Nml Nml Nml Nml Nml Nml Nml Nml N/A  Left Biceps Nml Nml Nml Nml Nml Nml Nml Nml Nml Nml Nml Nml N/A  Left Triceps Nml Nml Nml Nml Nml Nml Nml Nml Nml Nml Nml Nml N/A  Left Deltoid Nml Nml Nml Nml Nml Nml Nml Nml Nml Nml Nml Nml N/A      Waveforms:

## 2019-02-11 ENCOUNTER — Other Ambulatory Visit: Payer: Self-pay | Admitting: Obstetrics & Gynecology

## 2019-02-11 DIAGNOSIS — Z1231 Encounter for screening mammogram for malignant neoplasm of breast: Secondary | ICD-10-CM

## 2019-05-17 ENCOUNTER — Other Ambulatory Visit: Payer: Self-pay

## 2019-05-17 ENCOUNTER — Ambulatory Visit
Admission: RE | Admit: 2019-05-17 | Discharge: 2019-05-17 | Disposition: A | Discharge status: Home / Self Care | Payer: PRIVATE HEALTH INSURANCE | Source: Ambulatory Visit | Attending: Obstetrics & Gynecology | Admitting: Obstetrics & Gynecology

## 2019-05-17 DIAGNOSIS — Z1231 Encounter for screening mammogram for malignant neoplasm of breast: Secondary | ICD-10-CM

## 2019-05-28 IMAGING — MG 2D DIGITAL SCREENING BILATERAL MAMMOGRAM WITH 3D TOMO WITH CAD
8 of 12 series · 8 of 28 positions shown · non-contrast
Comparison: Previous exam(s).

CLINICAL DATA: Screening.

EXAM:
2D DIGITAL SCREENING BILATERAL MAMMOGRAM WITH 3D TOMO WITH CAD

[R CC]
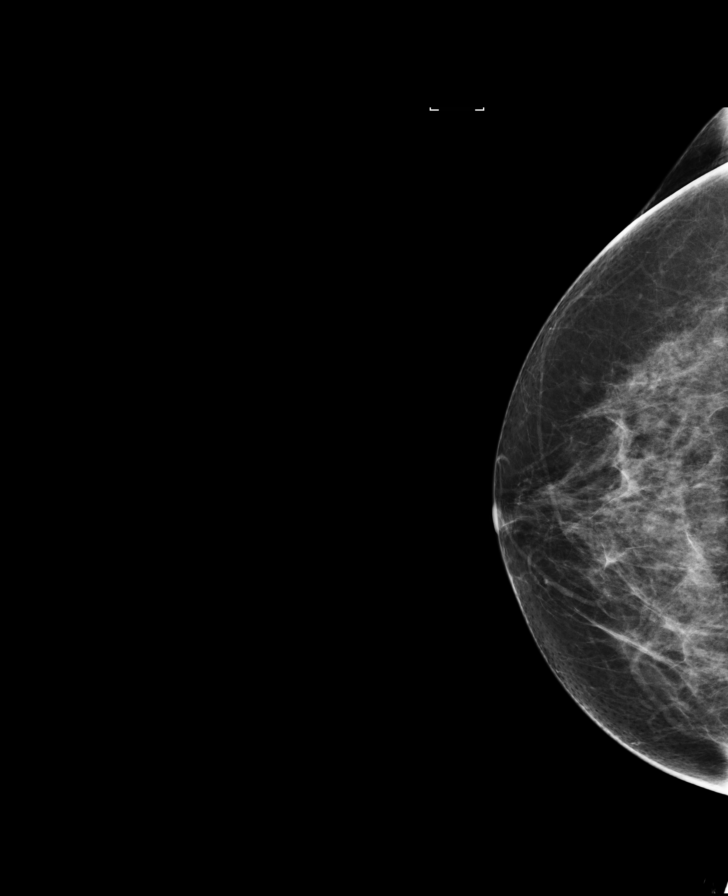

[L CC synth-2D]
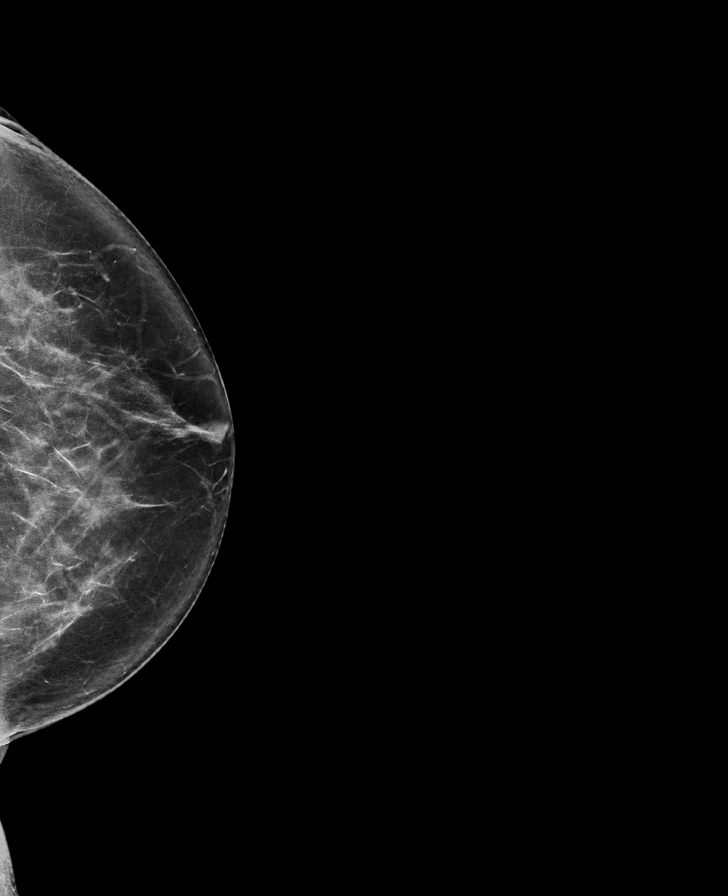

[L MLO]
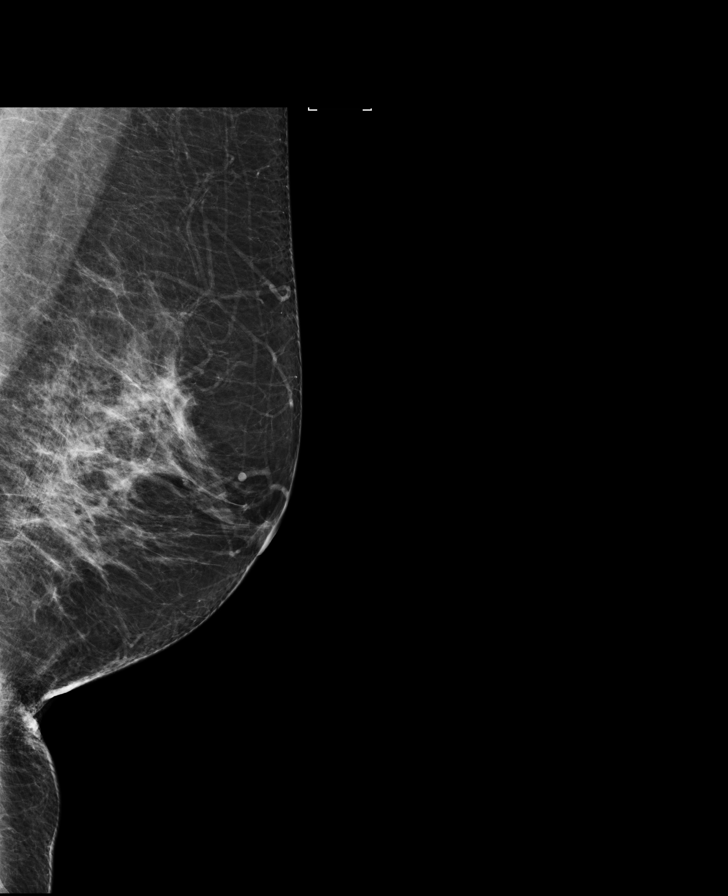

[R CC synth-2D]
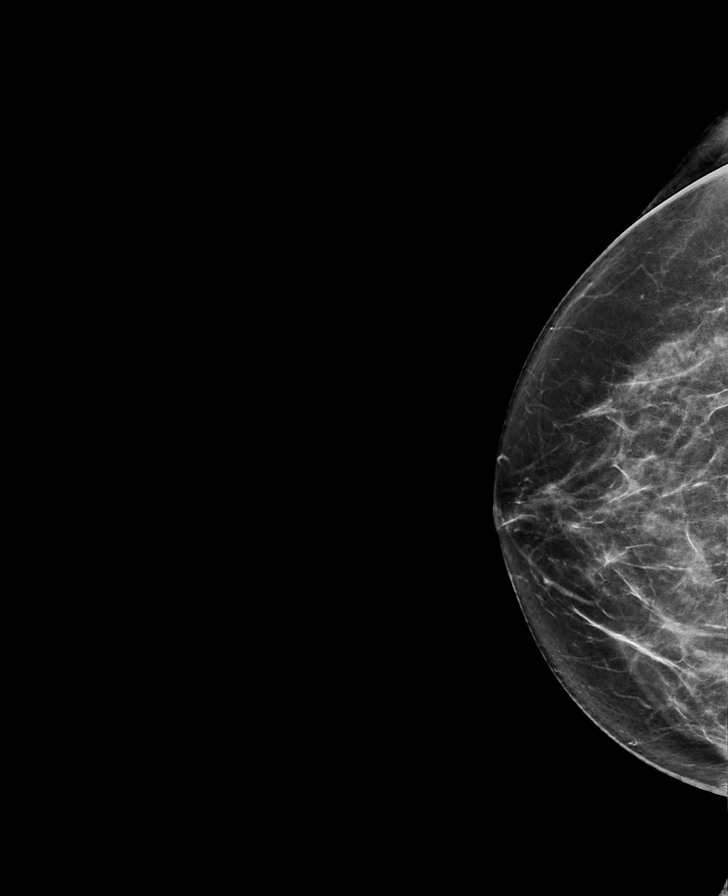

[L MLO synth-2D]
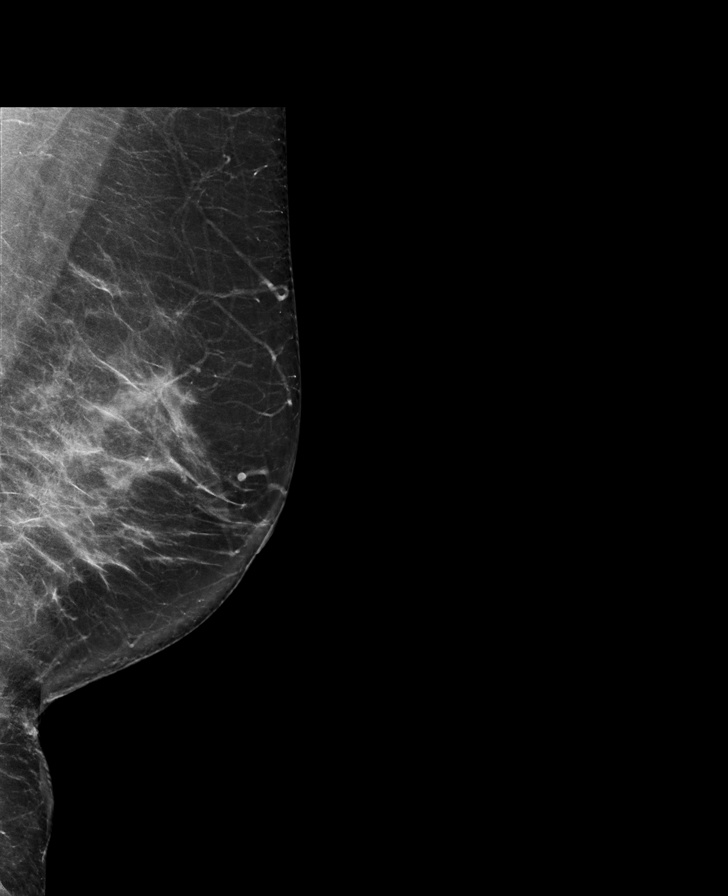

[R MLO synth-2D]
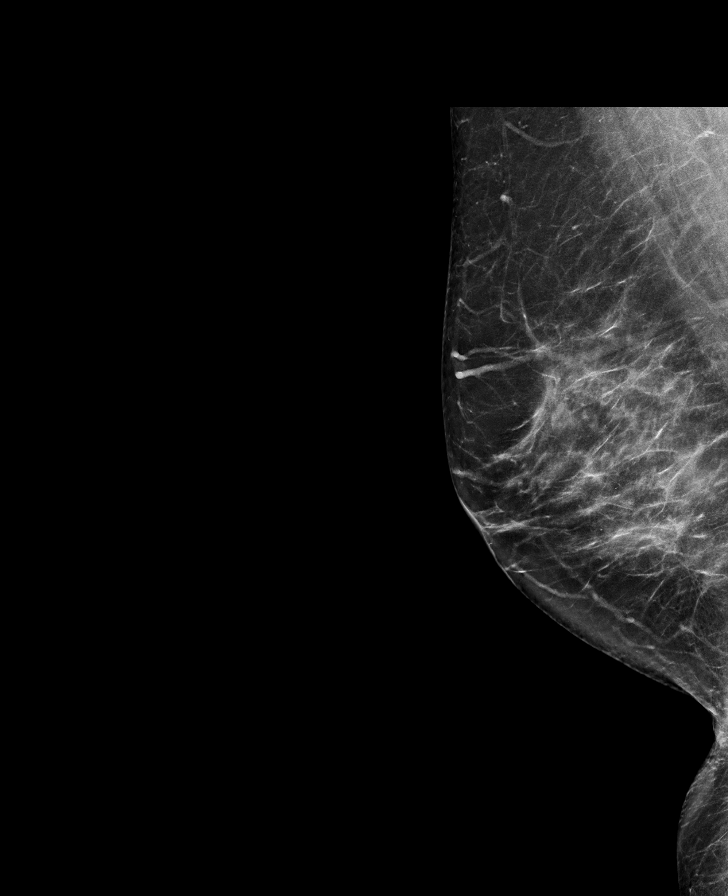

[R MLO]
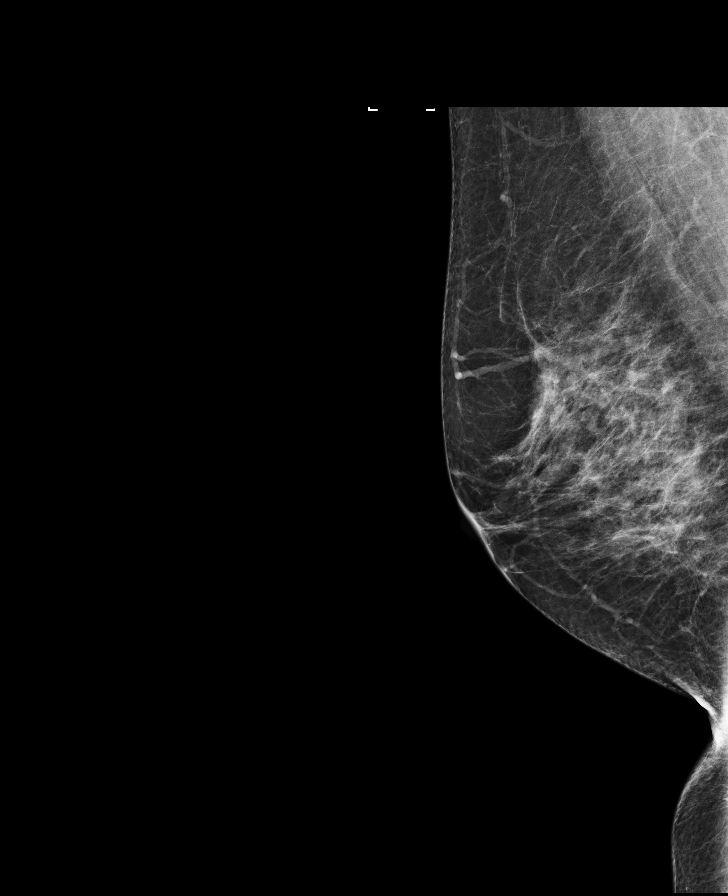

[L CC]
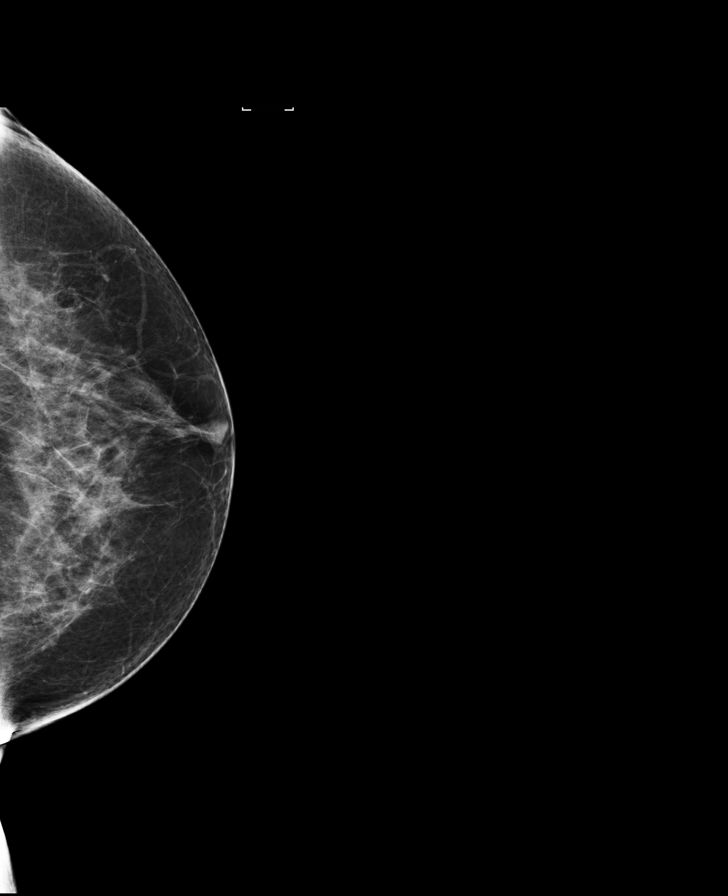

[8 of 28 positions shown; findings below may reference images not displayed]

ACR Breast Density Category c: The breast tissue is heterogeneously
dense, which may obscure small masses.
FINDINGS: There are no findings suspicious for malignancy. Images were
processed with CAD.
IMPRESSION: No mammographic evidence of malignancy. A result letter of this
screening mammogram will be mailed directly to the patient.

RECOMMENDATION:
Screening mammogram in one year. (Code:UA-9-KQN)

BI-RADS CATEGORY  1: Negative.

## 2020-02-05 ENCOUNTER — Other Ambulatory Visit: Payer: Self-pay | Admitting: Obstetrics & Gynecology

## 2020-02-05 DIAGNOSIS — Z1231 Encounter for screening mammogram for malignant neoplasm of breast: Secondary | ICD-10-CM

## 2020-02-24 ENCOUNTER — Other Ambulatory Visit: Payer: Self-pay | Admitting: Otolaryngology

## 2020-03-05 ENCOUNTER — Ambulatory Visit (INDEPENDENT_AMBULATORY_CARE_PROVIDER_SITE_OTHER): Payer: PRIVATE HEALTH INSURANCE | Admitting: Neurology

## 2020-03-05 ENCOUNTER — Ambulatory Visit: Payer: PRIVATE HEALTH INSURANCE | Admitting: Neurology

## 2020-03-05 DIAGNOSIS — Z0289 Encounter for other administrative examinations: Secondary | ICD-10-CM

## 2020-03-05 DIAGNOSIS — R200 Anesthesia of skin: Secondary | ICD-10-CM

## 2020-03-05 DIAGNOSIS — R202 Paresthesia of skin: Secondary | ICD-10-CM

## 2020-03-09 NOTE — Procedures (Signed)
      Full Name: Angela Randolph Gender: Female MRN #: [id number] Date of Birth: 05/03/1960    Visit Date: 03/05/2020 09:00 Age: 59 Years Examining Physician: Deardra Hinkley, MD  Referring Physician: Joseph Stern, MD    History: Left hand numbness digits 4-5.  Summary: EMG/NCS performed on the bilateral upper extremities.  The right Median 2nd Digit orthodromic sensory nerve showed prolonged distal peak latency (3.6 ms, N<3.4). The left  Median 2nd Digit orthodromic sensory nerve showed prolonged distal peak latency (4.0 ms, N<3.4) and reduced amplitude(8 uV, N>10). The left median/ulnar (palm) comparison nerve showed prolonged distal peak latency (Median Palm, 3.1 ms, N<2.2) and abnormal peak latency difference (Median Palm-Ulnar Palm, 0.8 ms, N<0.4) with a relative median delay.All remaining nerves (as indicated in the following tables) were within normal limits.  The left flexor digitorum profundus showed increased spontaneous activity and reduced motor unit recruitment.  The left pronator teres showed increased spontaneous activity and reduced motor unit recruitment.  The left first dorsal interosseous showed increased spontaneous activity.  The left extensor indices proprius showed increased spontaneous activity and reduced motor unit recruitment.  The left extensor digitorum communis showed increased spontaneous activity and reduced motor unit recruitment.All remaining muscles (as indicated in the following tables) were within normal limits.     Conclusion:  1. Acute/ongoing denervation and chronic neurogenic changes were seen in left arm distal muscles that share C7-C8 innervation.  In conjunction with normal sensory conductions, this is suggestive of C7-C8 radiculopathy. Recommend MRI of the cervical spine. 2.  There is also concomitant mild to moderate left carpal tunnel syndrome and possibly early/mild right carpal tunnel syndrome. 3.  No electrophysiologic evidence for ulnar neuropathy  across the elbow.  Angela Randolph, M.D.  [facility] [address] [place], [place] [address] Tel: [phone] Fax: [phone]  Verbal informed consent was obtained from the patient, patient was informed of potential risk of procedure, including bruising, bleeding, hematoma formation, infection, muscle weakness, muscle pain, numbness, among others.         MNC    Nerve / Sites Muscle Latency Ref. Amplitude Ref. Rel Amp Segments Distance Velocity Ref. Area    ms ms mV mV %  cm m/s m/s mVms  L Median - APB     Wrist APB 4.4 ?4.4 7.4 ?4.0 100 Wrist - APB 7   22.0     Upper arm APB 8.1  7.3  99.6 Upper arm - Wrist 21 56 ?49 21.3  R Median - APB     Wrist APB 3.9 ?4.4 7.0 ?4.0 100 Wrist - APB 7   19.9     Upper arm APB 8.0  6.4  91.6 Upper arm - Wrist 22 54 ?49 17.3  L Ulnar - ADM     Wrist ADM 2.9 ?3.3 10.3 ?6.0 100 Wrist - ADM 7   32.8     B.Elbow ADM 5.8  9.3  90.5 B.Elbow - Wrist 20 68 ?49 27.4     A.Elbow ADM 7.3  8.4  90.6 A.Elbow - B.Elbow 10 67 ?49 26.3         A.Elbow - Wrist      R Ulnar - ADM     Wrist ADM 3.0 ?3.3 8.9 ?6.0 100 Wrist - ADM 7   20.8     B.Elbow ADM 6.4  8.2  92.1 B.Elbow - Wrist 21 62 ?49 18.8     A.Elbow ADM 8.0  8.2  100 A.Elbow - B.Elbow   10 62 ?49 18.1         A.Elbow - Wrist      L Ulnar - FDI     Wrist FDI 3.4 ?4.5 7.4 ?7.0 100 Wrist - FDI 8   14.6     B.Elbow FDI 6.6  5.9  79.5 B.Elbow - Wrist 19 59 ?49 14.2     A.Elbow FDI 8.3  5.7  96.5 A.Elbow - B.Elbow 10 59 ?49 12.4         A.Elbow - Wrist      R Ulnar - FDI     Wrist FDI 3.6 ?4.5 8.9 ?7.0 100 Wrist - FDI 8   17.1     B.Elbow FDI 7.0  8.5  95.1 B.Elbow - Wrist 19 56 ?49 18.2     A.Elbow FDI 8.9  7.6  89.5 A.Elbow - B.Elbow 10 55 ?49 17.3         A.Elbow - Wrist                       SNC    Nerve / Sites Rec. Site Peak Lat Ref.  Amp Ref. Segments Distance Peak Diff Ref.    ms ms V V  cm ms ms  L Median, Ulnar - Transcarpal comparison     Median Palm Wrist 3.1 ?2.2  54 ?35 Median Palm - Wrist 8       Ulnar Palm Wrist 2.2 ?2.2 8 ?12 Ulnar Palm - Wrist 8          Median Palm - Ulnar Palm  0.8 ?0.4  L Median - Orthodromic (Dig II, Mid palm)     Dig II Wrist 4.0 ?3.4 8 ?10 Dig II - Wrist 13    R Median - Orthodromic (Dig II, Mid palm)     Dig II Wrist 3.6 ?3.4 10 ?10 Dig II - Wrist 13    L Ulnar - Orthodromic, (Dig V, Mid palm)     Dig V Wrist 2.5 ?3.1 8 ?5 Dig V - Wrist 11    R Ulnar - Orthodromic, (Dig V, Mid palm)     Dig V Wrist 2.4 ?3.1 5 ?5 Dig V - Wrist 76                 F  Wave    Nerve F Lat Ref.   ms ms  L Ulnar - ADM 29.2 ?32.0  R Ulnar - ADM 27.0 ?32.0         EMG Summary Table    Spontaneous MUAP Recruitment  Muscle IA Fib PSW Fasc Other Amp Dur. Poly Pattern  L. Deltoid Normal None None None _______ Normal Normal Normal Normal  L. Triceps brachii Normal None None None _______ Normal Normal Normal Normal  L. Flexor digitorum profundus (Ulnar) Normal None 2+ None _______ Normal Normal Normal Reduced  L. Pronator teres Normal None 2+ None _______ Normal Normal Normal Reduced  L. First dorsal interosseous Normal None 3+ None _______ Normal Normal Normal Normal  L. Opponens pollicis Normal None None None _______ Normal Normal Normal Normal  L. Abductor digiti minimi (manus) Normal None None None _______ Normal Normal Normal Normal  L. Abductor pollicis brevis Normal None None None _______ Normal Normal Normal Normal  L. Extensor indicis proprius Normal None 2+ None _______ Normal Normal Normal Reduced  L. Extensor digitorum communis Normal None 2+ None _______ Normal Normal Normal Reduced

## 2020-03-09 NOTE — Progress Notes (Signed)
Left arm numbness; see procedure note

## 2020-03-09 NOTE — Progress Notes (Signed)
Full Name: Angela Randolph Gender: Female MRN #: [id number] Date of Birth: 11/16/1960    Visit Date: 03/05/2020 09:00 Age: 59 Years Examining Physician: Naomie Dean, MD  Referring Physician: Maeola Harman, MD    History: Left hand numbness digits 4-5.  Summary: EMG/NCS performed on the bilateral upper extremities.  The right Median 2nd Digit orthodromic sensory nerve showed prolonged distal peak latency (3.6 ms, N<3.4). The left  Median 2nd Digit orthodromic sensory nerve showed prolonged distal peak latency (4.0 ms, N<3.4) and reduced amplitude(8 uV, N>10). The left median/ulnar (palm) comparison nerve showed prolonged distal peak latency (Median Palm, 3.1 ms, N<2.2) and abnormal peak latency difference (Median Palm-Ulnar Palm, 0.8 ms, N<0.4) with a relative median delay.All remaining nerves (as indicated in the following tables) were within normal limits.  The left flexor digitorum profundus showed increased spontaneous activity and reduced motor unit recruitment.  The left pronator teres showed increased spontaneous activity and reduced motor unit recruitment.  The left first dorsal interosseous showed increased spontaneous activity.  The left extensor indices proprius showed increased spontaneous activity and reduced motor unit recruitment.  The left extensor digitorum communis showed increased spontaneous activity and reduced motor unit recruitment.All remaining muscles (as indicated in the following tables) were within normal limits.     Conclusion:  1. Acute/ongoing denervation and chronic neurogenic changes were seen in left arm distal muscles that share C7-C8 innervation.  In conjunction with normal sensory conductions, this is suggestive of C7-C8 radiculopathy. Recommend MRI of the cervical spine. 2.  There is also concomitant mild to moderate left carpal tunnel syndrome and possibly early/mild right carpal tunnel syndrome. 3.  No electrophysiologic evidence for ulnar neuropathy  across the elbow.  Naomie Dean, M.D.  [facility] [address] [place], [place] [address] Tel: [phone] Fax: [phone]  Verbal informed consent was obtained from the patient, patient was informed of potential risk of procedure, including bruising, bleeding, hematoma formation, infection, muscle weakness, muscle pain, numbness, among others.         MNC    Nerve / Sites Muscle Latency Ref. Amplitude Ref. Rel Amp Segments Distance Velocity Ref. Area    ms ms mV mV %  cm m/s m/s mVms  L Median - APB     Wrist APB 4.4 ?4.4 7.4 ?4.0 100 Wrist - APB 7   22.0     Upper arm APB 8.1  7.3  99.6 Upper arm - Wrist 21 56 ?49 21.3  R Median - APB     Wrist APB 3.9 ?4.4 7.0 ?4.0 100 Wrist - APB 7   19.9     Upper arm APB 8.0  6.4  91.6 Upper arm - Wrist 22 54 ?49 17.3  L Ulnar - ADM     Wrist ADM 2.9 ?3.3 10.3 ?6.0 100 Wrist - ADM 7   32.8     B.Elbow ADM 5.8  9.3  90.5 B.Elbow - Wrist 20 68 ?49 27.4     A.Elbow ADM 7.3  8.4  90.6 A.Elbow - B.Elbow 10 67 ?49 26.3         A.Elbow - Wrist      R Ulnar - ADM     Wrist ADM 3.0 ?3.3 8.9 ?6.0 100 Wrist - ADM 7   20.8     B.Elbow ADM 6.4  8.2  92.1 B.Elbow - Wrist 21 62 ?49 18.8     A.Elbow ADM 8.0  8.2  100 A.Elbow - B.Elbow  10 62 ?49 18.1         A.Elbow - Wrist      L Ulnar - FDI     Wrist FDI 3.4 ?4.5 7.4 ?7.0 100 Wrist - FDI 8   14.6     B.Elbow FDI 6.6  5.9  79.5 B.Elbow - Wrist 19 59 ?49 14.2     A.Elbow FDI 8.3  5.7  96.5 A.Elbow - B.Elbow 10 59 ?49 12.4         A.Elbow - Wrist      R Ulnar - FDI     Wrist FDI 3.6 ?4.5 8.9 ?7.0 100 Wrist - FDI 8   17.1     B.Elbow FDI 7.0  8.5  95.1 B.Elbow - Wrist 19 56 ?49 18.2     A.Elbow FDI 8.9  7.6  89.5 A.Elbow - B.Elbow 10 55 ?49 17.3         A.Elbow - Wrist                       SNC    Nerve / Sites Rec. Site Peak Lat Ref.  Amp Ref. Segments Distance Peak Diff Ref.    ms ms V V  cm ms ms  L Median, Ulnar - Transcarpal comparison     Median Palm Wrist 3.1 ?2.2  54 ?35 Median Palm - Wrist 8       Ulnar Palm Wrist 2.2 ?2.2 8 ?12 Ulnar Palm - Wrist 8          Median Palm - Ulnar Palm  0.8 ?0.4  L Median - Orthodromic (Dig II, Mid palm)     Dig II Wrist 4.0 ?3.4 8 ?10 Dig II - Wrist 13    R Median - Orthodromic (Dig II, Mid palm)     Dig II Wrist 3.6 ?3.4 10 ?10 Dig II - Wrist 13    L Ulnar - Orthodromic, (Dig V, Mid palm)     Dig V Wrist 2.5 ?3.1 8 ?5 Dig V - Wrist 11    R Ulnar - Orthodromic, (Dig V, Mid palm)     Dig V Wrist 2.4 ?3.1 5 ?5 Dig V - Wrist 76                 F  Wave    Nerve F Lat Ref.   ms ms  L Ulnar - ADM 29.2 ?32.0  R Ulnar - ADM 27.0 ?32.0         EMG Summary Table    Spontaneous MUAP Recruitment  Muscle IA Fib PSW Fasc Other Amp Dur. Poly Pattern  L. Deltoid Normal None None None _______ Normal Normal Normal Normal  L. Triceps brachii Normal None None None _______ Normal Normal Normal Normal  L. Flexor digitorum profundus (Ulnar) Normal None 2+ None _______ Normal Normal Normal Reduced  L. Pronator teres Normal None 2+ None _______ Normal Normal Normal Reduced  L. First dorsal interosseous Normal None 3+ None _______ Normal Normal Normal Normal  L. Opponens pollicis Normal None None None _______ Normal Normal Normal Normal  L. Abductor digiti minimi (manus) Normal None None None _______ Normal Normal Normal Normal  L. Abductor pollicis brevis Normal None None None _______ Normal Normal Normal Normal  L. Extensor indicis proprius Normal None 2+ None _______ Normal Normal Normal Reduced  L. Extensor digitorum communis Normal None 2+ None _______ Normal Normal Normal Reduced

## 2020-03-26 ENCOUNTER — Encounter: Payer: PRIVATE HEALTH INSURANCE | Admitting: Neurology

## 2020-05-18 ENCOUNTER — Ambulatory Visit
Admission: RE | Admit: 2020-05-18 | Discharge: 2020-05-18 | Disposition: A | Discharge status: Home / Self Care | Payer: PRIVATE HEALTH INSURANCE | Source: Ambulatory Visit | Attending: Obstetrics & Gynecology | Admitting: Obstetrics & Gynecology

## 2020-05-18 ENCOUNTER — Other Ambulatory Visit: Payer: Self-pay

## 2020-05-18 DIAGNOSIS — Z1231 Encounter for screening mammogram for malignant neoplasm of breast: Secondary | ICD-10-CM

## 2021-02-16 ENCOUNTER — Encounter (INDEPENDENT_AMBULATORY_CARE_PROVIDER_SITE_OTHER): Payer: Self-pay

## 2021-02-17 ENCOUNTER — Encounter (INDEPENDENT_AMBULATORY_CARE_PROVIDER_SITE_OTHER): Payer: Self-pay

## 2021-02-17 NOTE — Telephone Encounter (Signed)
This is for you!

## 2021-03-24 LAB — BASIC METABOLIC PANEL
BUN: 14 (ref 4–21)
CO2: 27 — AB (ref 13–22)
Chloride: 104 (ref 99–108)
Creatinine: 0.7 (ref <=1.1)
Glucose: 82
Potassium: 4.8 mEq/L (ref 3.5–5.1)
Sodium: 143 (ref 137–147)

## 2021-03-24 LAB — COMPREHENSIVE METABOLIC PANEL
Albumin: 4.5 (ref 3.5–5.0)
Calcium: 9.7 (ref 8.7–10.7)
Globulin: 2.2
eGFR: 100

## 2021-03-24 LAB — HEPATIC FUNCTION PANEL
ALT: 27 U/L (ref 7–35)
AST: 23 (ref 13–35)
Alkaline Phosphatase: 86 (ref 25–125)
Bilirubin, Total: 0.3

## 2021-03-24 LAB — TSH: TSH: 0.94 (ref <=5.90)

## 2021-03-24 LAB — CBC: RBC: 4.61 (ref 3.87–5.11)

## 2021-03-24 LAB — CBC AND DIFFERENTIAL
HCT: 40 (ref 36–46)
Hemoglobin: 13 (ref 12.0–16.0)
Platelets: 248 10*3/uL (ref 150–400)
WBC: 7.1

## 2021-03-24 LAB — HEMOGLOBIN A1C: Hemoglobin A1C: 5.7

## 2021-03-24 LAB — LIPID PANEL
HDL: 76 — AB (ref 35–70)
LDL Cholesterol: 117
Triglycerides: 54 (ref 40–160)

## 2021-03-25 ENCOUNTER — Other Ambulatory Visit: Payer: Self-pay | Admitting: Obstetrics & Gynecology

## 2021-03-25 DIAGNOSIS — Z13820 Encounter for screening for osteoporosis: Secondary | ICD-10-CM

## 2021-03-25 DIAGNOSIS — Z1231 Encounter for screening mammogram for malignant neoplasm of breast: Secondary | ICD-10-CM

## 2021-05-19 ENCOUNTER — Ambulatory Visit
Admission: RE | Admit: 2021-05-19 | Discharge: 2021-05-19 | Disposition: A | Discharge status: Home / Self Care | Payer: No Typology Code available for payment source | Source: Ambulatory Visit | Attending: Obstetrics & Gynecology | Admitting: Obstetrics & Gynecology

## 2021-05-19 DIAGNOSIS — Z1231 Encounter for screening mammogram for malignant neoplasm of breast: Secondary | ICD-10-CM

## 2021-05-21 ENCOUNTER — Other Ambulatory Visit: Payer: Self-pay | Admitting: Obstetrics & Gynecology

## 2021-05-21 DIAGNOSIS — R928 Other abnormal and inconclusive findings on diagnostic imaging of breast: Secondary | ICD-10-CM

## 2021-05-31 ENCOUNTER — Ambulatory Visit
Admission: RE | Admit: 2021-05-31 | Discharge: 2021-05-31 | Disposition: A | Discharge status: Home / Self Care | Payer: No Typology Code available for payment source | Source: Ambulatory Visit | Attending: Obstetrics & Gynecology | Admitting: Obstetrics & Gynecology

## 2021-05-31 ENCOUNTER — Ambulatory Visit: Payer: No Typology Code available for payment source

## 2021-05-31 DIAGNOSIS — R928 Other abnormal and inconclusive findings on diagnostic imaging of breast: Secondary | ICD-10-CM

## 2021-06-07 DIAGNOSIS — Z0289 Encounter for other administrative examinations: Secondary | ICD-10-CM

## 2021-06-15 ENCOUNTER — Other Ambulatory Visit: Payer: Self-pay

## 2021-06-15 ENCOUNTER — Encounter (INDEPENDENT_AMBULATORY_CARE_PROVIDER_SITE_OTHER): Payer: Self-pay | Admitting: Family Medicine

## 2021-06-15 ENCOUNTER — Ambulatory Visit (INDEPENDENT_AMBULATORY_CARE_PROVIDER_SITE_OTHER): Payer: No Typology Code available for payment source | Admitting: Family Medicine

## 2021-06-15 VITALS — BP 112/77 | HR 83 | Temp 97.8°F | Ht 64.0 in | Wt 179.0 lb

## 2021-06-15 DIAGNOSIS — Z9189 Other specified personal risk factors, not elsewhere classified: Secondary | ICD-10-CM

## 2021-06-15 DIAGNOSIS — E538 Deficiency of other specified B group vitamins: Secondary | ICD-10-CM

## 2021-06-15 DIAGNOSIS — R0602 Shortness of breath: Secondary | ICD-10-CM | POA: Diagnosis not present

## 2021-06-15 DIAGNOSIS — R7303 Prediabetes: Secondary | ICD-10-CM | POA: Diagnosis not present

## 2021-06-15 DIAGNOSIS — R5383 Other fatigue: Secondary | ICD-10-CM | POA: Diagnosis not present

## 2021-06-15 DIAGNOSIS — F39 Unspecified mood [affective] disorder: Secondary | ICD-10-CM

## 2021-06-15 DIAGNOSIS — E668 Other obesity: Secondary | ICD-10-CM

## 2021-06-15 DIAGNOSIS — K219 Gastro-esophageal reflux disease without esophagitis: Secondary | ICD-10-CM | POA: Diagnosis not present

## 2021-06-15 DIAGNOSIS — Z6830 Body mass index (BMI) 30.0-30.9, adult: Secondary | ICD-10-CM

## 2021-06-15 DIAGNOSIS — Z1331 Encounter for screening for depression: Secondary | ICD-10-CM

## 2021-06-15 DIAGNOSIS — E669 Obesity, unspecified: Secondary | ICD-10-CM

## 2021-06-15 DIAGNOSIS — K5904 Chronic idiopathic constipation: Secondary | ICD-10-CM

## 2021-06-15 NOTE — Progress Notes (Signed)
Office: [phone]  /  Fax: [phone]    Date: June 22, 2021   Appointment Start Time: 11:12am Duration: 35 minutes Provider: Lawerance Randolph, Psy.D. Type of Session: Intake for Individual Therapy  Location of Patient: Work (private location)  Location of Provider: Provider's home (private office) Type of Contact: Telepsychological Visit via MyChart Video Visit  Informed Consent: This provider called Angela Randolph at 11:03am as she did not present for today's appointment. She indicated she was at work. Assistance was provided to connect for today's appointment. As such, today's appointment was initiated 12 minutes late. Prior to proceeding with today's appointment, two pieces of identifying information were obtained. In addition, Angela Randolph's physical location at the time of this appointment was obtained as well a phone number she could be reached at in the event of technical difficulties. Angela Randolph and this provider participated in today's telepsychological service.   The provider's role was explained to Angela Randolph. The provider reviewed and discussed issues of confidentiality, privacy, and limits therein (e.g., reporting obligations). In addition to verbal informed consent, written informed consent for psychological services was obtained prior to the initial appointment. Since the clinic is not a 24/7 crisis [facility], mental health emergency resources were shared and this  provider explained MyChart, e-mail, voicemail, and/or other messaging systems should be utilized only for non-emergency reasons. This provider also explained that information obtained during appointments will be placed in Angela Randolph's medical record and relevant information will be shared with other providers at Angela Randolph for coordination of care. Angela Randolph agreed information may be shared with other Angela Randolph providers as needed for coordination of care and by signing the service agreement  document, she provided written consent for coordination of care. Prior to initiating telepsychological services, Angela Randolph completed an informed consent document, which included the development of a safety plan (i.e., an emergency contact and emergency resources) in the event of an emergency/crisis. Angela Randolph verbally acknowledged understanding she is ultimately responsible for understanding her insurance benefits for telepsychological and in-person services. This provider also reviewed confidentiality, as it relates to telepsychological services, as well as the rationale for telepsychological services (i.e., to reduce exposure risk to COVID-19). Angela Randolph  acknowledged understanding that appointments cannot be recorded without both party consent and she is aware she is responsible for securing confidentiality on her end of the session. Angela Randolph verbally consented to proceed.  Chief Complaint/HPI: Angela Randolph was referred by Angela Randolph due to mood disorder with emotional eating. Per the note for the initial visit with Angela Randolph on June 15, 2021, "Pt has failed Lexapro in the past due to side effects. PHQ= 17.  She suffers from some depressive sxs at times but tells me today this is not an acute concern at this time.  She denies the need for medication adjustment currently.  Angela Randolph denies suicidal or homicidal ideations.  Medication: duloxetine " The note for the initial appointment further indicated the following: "Angela Randolph's habits were reviewed today and are as follows: Her family eats meals together, she thinks her family will eat healthier with her, she struggles with family and or coworkers weight loss sabotage, her desired weight loss is 39-44 lbs, she has been heavy most of her life, she started gaining weight after childbirth, her heaviest weight ever was 193 pounds, she has significant food cravings issues, she snacks frequently in the evenings, she skips meals frequently, she is frequently  drinking liquids with calories, she frequently makes poor food choices, she has problems with excessive  hunger, she frequently eats larger portions than normal, and she struggles with emotional eating." Angela Randolph's Food and Mood (modified PHQ-9) score on June 15, 2021 was 17.  During today's appointment, Angela Randolph reported, "I can eat about for anything emotional situation. Happy, sad, tired. It's just my go to for comfort." She feels the onset of emotional eating behaviors was likely 15 years ago and described the current frequency as a few times a week. In addition, Angela Randolph denied a history of binge eating behaviors. Angela Randolph denied a history of significantly restricting food intake, purging and engagement in other compensatory strategies, and has never been diagnosed with an eating disorder. She also denied a history of treatment for emotional eating. Furthermore, Angela Randolph discussed ongoing GI-related concerns, noting she has a GI doctor and Dr. Sharee Randolph is also aware of the concerns.   Mental Status Examination:  Appearance: neat Behavior: appropriate to circumstances Mood: anxious Affect: mood congruent; tearful at times Speech: WNL Eye Contact: intermittent Psychomotor Activity: WNL Gait: unable to assess  Thought Process: linear, logical, and goal directed and denies suicidal, homicidal, and self-harm ideation, plan and intent  Thought Content/Perception: no hallucinations, delusions, bizarre thinking or behavior endorsed or observed Orientation: AAOx4 Memory/Concentration: memory, attention, language, and fund of knowledge intact  Insight/Judgment: fair  Family & Psychosocial History: Angela Randolph reported she is married and she has one adult son. 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 Additionally, Angela Randolph shared her highest level of education obtained is a BS degree. Currently, Angela Randolph's social support system consists of her husband, family and friends. Moreover,  Angela Randolph stated she resides with her husband.   Medical History:  Past Medical History:  Diagnosis Date   Anxiety    Constipation    Gastritis    Joint pain    Pre-diabetes    Past Surgical History:  Procedure Laterality Date   BACK SURGERY     CESAREAN SECTION     right carpal tunel     Current Outpatient Medications on File Prior to Visit  Medication Sig Dispense Refill   Cetirizine HCl (ZYRTEC ALLERGY) 10 MG CAPS Take 1 capsule by mouth daily.     DULoxetine (CYMBALTA) 30 MG capsule Take 30 mg by mouth daily.     Multiple Vitamins-Minerals (MULTIVITAMIN WITH MINERALS) tablet Take 1 tablet by mouth daily.     pantoprazole (PROTONIX) 20 MG tablet Take 20 mg by mouth. As needed     valACYclovir (VALTREX) 500 MG tablet Take 500 mg by mouth as needed.     vitamin B-12 (CYANOCOBALAMIN) 1000 MCG tablet Take 1,000 mcg by mouth daily.     No current facility-administered medications on file prior to visit.  Medication compliant.   Mental Health History: Angela Randolph reported she has never attended therapeutic services. She indicated her gynecologist/PCP currently prescribes Cymbalta, noting she was diagnosed with anxiety around 2008. She recalled she was previously prescribed Paxil. Angela Randolph reported there is no history of hospitalizations for psychiatric concerns. Angela Randolph noted a family history of depression (father) and alcohol abuse (younger brother). Angela Randolph reported there is no history of trauma including psychological, physical , and sexual abuse, as well as neglect.   Angela Randolph described her typical mood lately as "anxious." She explained "little things" can contribute to anxiety and described it as generalized. Angela Randolph denied current alcohol use. She denied tobacco use. She denied illicit/recreational substance use. Regarding caffeine intake, Angela Randolph reported she consumes one cup (~10 oz) of coffee daily and 1-2 Diet Pepsi (~24 oz) daily. Furthermore,  Angela Randolph indicated she is not experiencing  the following: hallucinations and delusions, paranoia, symptoms of mania , social withdrawal, crying spells, panic attacks, memory concerns, attention and concentration issues, and obsessions and compulsions. She also denied history of and current suicidal ideation, plan, and intent; history of and current homicidal ideation, plan, and intent; and history of and current engagement in self-harm.  The following strengths were reported by Angela Randolph: kindness, really caring about others, helpful, good person, great wife, great daughter, and great mother. The following strengths were observed by this provider: ability to express thoughts and feelings during the therapeutic session, ability to establish and benefit from a therapeutic relationship, willingness to work toward established goal(s) with the clinic and ability to engage in reciprocal conversation.   Legal History: Angela Randolph reported there is no history of legal involvement.   Structured Assessments Results: The Patient Health Questionnaire-9 (PHQ-9) is a self-report measure that assesses symptoms and severity of depression over the course of the last two weeks. Angela Randolph obtained a score of 6 suggesting mild depression. Angela Randolph finds the endorsed symptoms to be not difficult at all. [0= Not at all; 1= Several days; 2= More than half the days; 3= Nearly every day] Little interest or pleasure in doing things 0  Feeling down, depressed, or hopeless 0  Trouble falling or staying asleep, or sleeping too much 1  Feeling tired or having little energy 3  Poor appetite or overeating 0  Feeling bad about yourself --- or that you are a failure or have let yourself or your family down 1  Trouble concentrating on things, such as reading the newspaper or watching television 1  Moving or speaking so slowly that other people could have noticed? Or the opposite --- being so fidgety or restless that you have been moving around a Randolph more than usual 0  Thoughts that you  would be better off dead or hurting yourself in some way 0  PHQ-9 Score 6    The Generalized Anxiety Disorder-7 (GAD-7) is a brief self-report measure that assesses symptoms of anxiety over the course of the last two weeks. Maelynn obtained a score of 5 suggesting mild anxiety. Angela Randolph finds the endorsed symptoms to be not difficult at all. [0= Not at all; 1= Several days; 2= Over half the days; 3= Nearly every day] Feeling nervous, anxious, on edge 1  Not being able to stop or control worrying 1  Worrying too much about different things 1  Trouble relaxing 1  Being so restless that it's hard to sit still 0  Becoming easily annoyed or irritable 1  Feeling afraid as if something awful might happen 0  GAD-7 Score 5   Interventions:  Conducted a chart review Focused on rapport building Verbally administered PHQ-9 and GAD-7 for symptom monitoring Provided emphatic reflections and validation Collaborated with patient on a treatment goal  Psychoeducation provided regarding physical versus emotional hunger  Provisional DSM-5 Diagnosis(es): F50.89 Other Specified Feeding or Eating Disorder, Emotional Eating Behaviors and F41.9 Unspecified Anxiety Disorder  Plan: Phoenix appears able and willing to participate as evidenced by collaboration on a treatment goal, engagement in reciprocal conversation, and asking questions as needed for clarification. The next appointment will be scheduled in two weeks, which will be via MyChart Video Visit. The following treatment goal was established: increase coping skills. This provider will regularly review the treatment plan and medical chart to keep informed of status changes. Ivie expressed understanding and agreement with the initial treatment plan of care. Angela Randolph  will be sent a handout via e-mail to utilize between now and the next appointment to increase awareness of hunger patterns and subsequent eating. Angela BeechamCynthia provided verbal consent during today's  appointment for this provider to send the handout via e-mail.

## 2021-06-16 ENCOUNTER — Encounter (INDEPENDENT_AMBULATORY_CARE_PROVIDER_SITE_OTHER): Payer: Self-pay | Admitting: Family Medicine

## 2021-06-16 LAB — HEMOGLOBIN A1C
Est. average glucose Bld gHb Est-mCnc: 117 mg/dL
Hgb A1c MFr Bld: 5.7 % — ABNORMAL HIGH (ref 4.8–5.6)

## 2021-06-16 LAB — CBC WITH DIFFERENTIAL/PLATELET
Basophils Absolute: 0.1 10*3/uL (ref 0.0–0.2)
Basos: 1 %
EOS (ABSOLUTE): 0.2 10*3/uL (ref 0.0–0.4)
Eos: 2 %
Hematocrit: 41.2 % (ref 34.0–46.6)
Hemoglobin: 13.9 g/dL (ref 11.1–15.9)
Immature Grans (Abs): 0 10*3/uL (ref 0.0–0.1)
Immature Granulocytes: 0 %
Lymphocytes Absolute: 2.4 10*3/uL (ref 0.7–3.1)
Lymphs: 37 %
MCH: 28.9 pg (ref 26.6–33.0)
MCHC: 33.7 g/dL (ref 31.5–35.7)
MCV: 86 fL (ref 79–97)
Monocytes Absolute: 0.4 10*3/uL (ref 0.1–0.9)
Monocytes: 6 %
Neutrophils Absolute: 3.5 10*3/uL (ref 1.4–7.0)
Neutrophils: 54 %
Platelets: 270 10*3/uL (ref 150–450)
RBC: 4.81 x10E6/uL (ref 3.77–5.28)
RDW: 14 % (ref 11.7–15.4)
WBC: 6.5 10*3/uL (ref 3.4–10.8)

## 2021-06-16 LAB — COMPREHENSIVE METABOLIC PANEL
ALT: 20 IU/L (ref 0–32)
AST: 19 IU/L (ref 0–40)
Albumin/Globulin Ratio: 1.9 (ref 1.2–2.2)
Albumin: 4.6 g/dL (ref 3.8–4.9)
Alkaline Phosphatase: 99 IU/L (ref 44–121)
BUN/Creatinine Ratio: 22 (ref 12–28)
BUN: 15 mg/dL (ref 8–27)
Bilirubin Total: 0.4 mg/dL (ref 0.0–1.2)
CO2: 25 mmol/L (ref 20–29)
Calcium: 9.4 mg/dL (ref 8.7–10.3)
Chloride: 104 mmol/L (ref 96–106)
Creatinine, Ser: 0.67 mg/dL (ref 0.57–1.00)
Globulin, Total: 2.4 g/dL (ref 1.5–4.5)
Glucose: 88 mg/dL (ref 70–99)
Potassium: 4.4 mmol/L (ref 3.5–5.2)
Sodium: 144 mmol/L (ref 134–144)
Total Protein: 7 g/dL (ref 6.0–8.5)
eGFR: 100 mL/min/{1.73_m2} (ref >59)

## 2021-06-16 LAB — INSULIN, RANDOM: INSULIN: 6.8 u[IU]/mL (ref 2.6–24.9)

## 2021-06-16 LAB — LIPID PANEL WITH LDL/HDL RATIO
Cholesterol, Total: 199 mg/dL (ref 100–199)
HDL: 75 mg/dL (ref >39)
LDL Chol Calc (NIH): 115 mg/dL — ABNORMAL HIGH (ref 0–99)
LDL/HDL Ratio: 1.5 ratio (ref 0.0–3.2)
Triglycerides: 50 mg/dL (ref 0–149)
VLDL Cholesterol Cal: 9 mg/dL (ref 5–40)

## 2021-06-16 LAB — TSH: TSH: 1.03 u[IU]/mL (ref 0.450–4.500)

## 2021-06-16 LAB — VITAMIN B12: Vitamin B-12: 1306 pg/mL — ABNORMAL HIGH (ref 232–1245)

## 2021-06-16 LAB — T4, FREE: Free T4: 1.07 ng/dL (ref 0.82–1.77)

## 2021-06-16 LAB — FOLATE: Folate: 14.3 ng/mL (ref >3.0)

## 2021-06-16 LAB — VITAMIN D 25 HYDROXY (VIT D DEFICIENCY, FRACTURES): Vit D, 25-Hydroxy: 33.6 ng/mL (ref 30.0–100.0)

## 2021-06-16 NOTE — Progress Notes (Signed)
Chief Complaint:   OBESITY Angela Randolph (MR# [id number]) is a 61 y.o. female who presents for evaluation and treatment of obesity and related comorbidities. Current BMI is Body mass index is 30.73 kg/m. Angela Randolph has been struggling with her weight for many years and has been unsuccessful in either losing weight, maintaining weight loss, or reaching her healthy weight goal.  Angela Randolph is currently in the action stage of change and ready to dedicate time achieving and maintaining a healthier weight. Angela Randolph is interested in becoming our patient and working on intensive lifestyle modifications including (but not limited to) diet and exercise for weight loss.  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 Married to [employer], age 4 and has 1 older child (out of home). WW worked best in the past. She craves sweets and salty snacks and skips breakfast 6 days a week. She snacks on popcorn, chocolate with caramel, cookies, and pecans. Pt worst habit is drinking sweet tea. PCP is a GYN, Dr. Annamaria Helling. She recently had labs done about 3 months ago but was not told to bring them in today.  Angela Randolph's habits were reviewed today and are as follows: Her family eats meals together, she thinks her family will eat healthier with her, she struggles with family and or coworkers weight loss sabotage, her desired weight loss is 39-44 lbs, she has been heavy most of her life, she started gaining weight after childbirth, her heaviest weight ever was 193 pounds, she has significant food cravings issues, she snacks frequently in the evenings, she skips meals frequently, she is frequently drinking liquids with calories, she frequently makes poor food choices, she has problems with excessive hunger, she frequently eats larger portions than normal, and she struggles with emotional eating.  Depression Screen Angela Randolph's Food and Mood (modified PHQ-9) score was 17.  Depression screen Angela Randolph 2/9 06/15/2021   Decreased Interest 2  Down, Depressed, Hopeless 2  PHQ - 2 Score 4  Altered sleeping 1  Tired, decreased energy 3  Change in appetite 3  Feeling bad or failure about yourself  2  Trouble concentrating 2  Moving slowly or fidgety/restless 2  Suicidal thoughts 0  PHQ-9 Score 17  Difficult doing work/chores Somewhat difficult   Subjective:   1. Other fatigue Angela Randolph admits to daytime somnolence and admits to waking up still tired. Patient has a history of symptoms of daytime fatigue and morning fatigue. Angela Randolph generally gets  7.5  hours of sleep per night, and states that she has generally restful sleep. Snoring is present. Apneic episodes are not present. Epworth Sleepiness Score is 14.   2. SOB (shortness of breath) on exertion Angela Randolph notes increasing shortness of breath with exercising and seems to be worsening over time with weight gain. She notes getting out of breath sooner with activity than she used to. This has gotten worse recently. Angela Randolph denies shortness of breath at rest or orthopnea.  3. Prediabetes Recently dx with no h/o medication use. Pt's mom is diabetic.  4. Mood disorder (HCC)- emotional eating Pt has failed Lexapro in the past due to side effects. PHQ= 17.  She suffers from some depressive sxs at times but tells me today this is not an acute concern at this time.  She denies the need for medication adjustment currently.  Angela Randolph denies suicidal or homicidal ideations.  Medication: duloxetine   5. Gastroesophageal reflux disease, unspecified whether esophagitis present H/o bleeding ulcers and gastritis with meloxicam in the  past. Pt sees Dr. Levora AngelBrahmbhatt at [facility]. She takes pantoprazole once a week.  6. B12 deficiency due to diet Medication: OTC B12 100 mcg QD.  Patient does report fatigue  7. Chronic idiopathic constipation Pt takes fiber and Miralax QD. Her sxs are well controlled currently, but if she doesn't take meds, she will only have a BM every 5 days or  so.    Assessment/Plan:   Orders Placed This Encounter  Procedures   Comprehensive metabolic panel   CBC with Differential/Platelet   Hemoglobin A1c   Insulin, random   Lipid Panel With LDL/HDL Ratio   VITAMIN D 25 Hydroxy (Vit-D Deficiency, Fractures)   Vitamin B12   Folate   T4, free   TSH   EKG 12-Lead    Medications Discontinued During This Encounter  Medication Reason   meloxicam (MOBIC) 15 MG tablet    hydrOXYzine (ATARAX/VISTARIL) 50 MG tablet    PARoxetine (PAXIL) 20 MG tablet    pramoxine (SARNA SENSITIVE) 1 % LOTN    predniSONE (DELTASONE) 20 MG tablet    SUPER B COMPLEX/C PO       1. Other fatigue Angela Randolph does feel that her weight is causing her energy to be lower than it should be. Fatigue may be related to obesity, depression or many other causes. Labs will be ordered, and in the meanwhile, Angela Randolph will focus on self care including making healthy food choices, increasing physical activity and focusing on stress reduction. I recommend pt f/u with her PCP regarding her elevated ESS number. Being over 9 is high risk for OSA. Check labs today. EKG 12-Lead   2. SOB (shortness of breath) on exertion Angela Randolph does feel that she gets out of breath more easily that she used to when she exercises. [name] shortness of breath appears to be obesity related and exercise induced. She has agreed to work on weight loss and gradually increase exercise to treat her exercise induced shortness of breath. Will continue to monitor closely.   3. Prediabetes Angela Randolph will continue to work on weight loss, exercise, and decreasing simple carbohydrates to help decrease the risk of diabetes. Check labs today.  - Hemoglobin A1c - Insulin, random - Lipid Panel With LDL/HDL Ratio - VITAMIN D 25 Hydroxy (Vit-D Deficiency, Fractures) - Folate - T4, free - TSH   4. Mood disorder (HCC)- emotional eating We discussed the risks and benefits of counseling for her emotional eating.  I  explained this is a very important component of her weight loss journey.  She agrees to a referral to Dr. Dewaine CongerBarker.  Continue meds, Cymbalta per PCP and we will monitor closely as pt may need additional help with other meds. Check labs today. -Thyroid panel, vitamin D   5. Gastroesophageal reflux disease, unspecified whether esophagitis present Continue meds per GI. I recommend pt take meds QD to prevent burping and other GI sxs. Check labs today.  - Comprehensive metabolic panel - CBC with Differential/Platelet - Lipid Panel With LDL/HDL Ratio   6. B12 deficiency due to diet The diagnosis was reviewed with the patient. Counseling provided today, see below. We will continue to monitor. Orders and follow up as documented in patient record.  Counseling The body needs vitamin B12: to make red blood cells; to make DNA; and to help the nerves work properly so they can carry messages from the brain to the body.  The main causes of vitamin B12 deficiency include dietary deficiency, digestive diseases, pernicious anemia, and having a surgery in which  part of the stomach or small intestine is removed.  Certain medicines can make it harder for the body to absorb vitamin B12. These medicines include: heartburn medications; some antibiotics; some medications used to treat diabetes, gout, and high cholesterol.  In some cases, there are no symptoms of this condition. If the condition leads to anemia or nerve damage, various symptoms can occur, such as weakness or fatigue, shortness of breath, and numbness or tingling in your hands and feet.   Treatment:  May include taking vitamin B12 supplements.  Avoid alcohol.  Eat lots of healthy foods that contain vitamin B12: Beef, pork, chicken, [place], and organ meats, such as liver.  Seafood: This includes clams, rainbow trout, salmon, tuna, and haddock. Eggs.  Cereal and dairy products that are fortified: This means that vitamin B12 has been added to the food.   Check labs today.  - CBC with Differential/Platelet - Lipid Panel With LDL/HDL Ratio - VITAMIN D 25 Hydroxy (Vit-D Deficiency, Fractures) - Vitamin B12 - Folate - T4, free - TSH   7. Chronic idiopathic constipation Start prudent nutritional plan that is high in fiber and continue meds per GI.   8. Depression screening Angela Randolph had a positive depression screening. Depression is commonly associated with obesity and often results in emotional eating behaviors. We will monitor this closely and work on CBT to help improve the non-hunger eating patterns. Referral to Psychology may be required if no improvement is seen as she continues in our clinic.   9. At risk of diabetes mellitus - Amreen was given diabetes prevention education and counseling today of more than 23 minutes.  - Counseled patient on pathophysiology of disease and meaning/ implication of lab results.  - Reviewed how certain foods can either stimulate or inhibit insulin release, and subsequently affect hunger pathways  - Importance of following a healthy meal plan with limiting amounts of simple carbohydrates discussed with patient - Effects of regular aerobic exercise on blood sugar regulation reviewed and encouraged an eventual goal of 30 min 5d/week or more as a minimum.  - Briefly discussed treatment options, which always include dietary and lifestyle modification as first line.   - Handouts provided at patient's desire and/or told to go online to the American Diabetes Association website for further information.   10. Class 1 obesity with serious comorbidity and body mass index (BMI) of 30.0 to 30.9 in adult, unspecified obesity type Abbie is currently in the action stage of change and her goal is to continue with weight loss efforts. I recommend Richelle begin the structured treatment plan as follows:  She has agreed to the Category 2 Plan.  Exercise goals:  As is    Behavioral modification strategies: no  skipping meals, keeping healthy foods in the home, and planning for success.  She was informed of the importance of frequent follow-up visits to maximize her success with intensive lifestyle modifications for her multiple health conditions. She was informed we would discuss her lab results at her next visit unless there is a critical issue that needs to be addressed sooner. Raenah agreed to keep her next visit at the agreed upon time to discuss these results.  Objective:   Blood pressure 112/77, pulse 83, temperature 97.8 F (36.6 C), temperature source Oral, height 5\' 4"  (1.626 m), weight 179 lb (81.2 kg), SpO2 100 %. Body mass index is 30.73 kg/m.  EKG: abnormal- Sinus rhythm, rate 84, without any acute findings.  Indirect Calorimeter completed today shows a VO2 of  246 and a REE of 1699.  Her calculated basal metabolic rate is 4034 thus her basal metabolic rate is better than expected.  General: Cooperative, alert, well developed, in no acute distress. HEENT: Conjunctivae and lids unremarkable. Cardiovascular: Regular rhythm.  Lungs: Normal work of breathing. Neurologic: No focal deficits.   Lab Results  Component Value Date   CREATININE 0.67 06/15/2021   BUN 15 06/15/2021   NA 144 06/15/2021   K 4.4 06/15/2021   CL 104 06/15/2021   CO2 25 06/15/2021   Lab Results  Component Value Date   ALT 20 06/15/2021   AST 19 06/15/2021   ALKPHOS 99 06/15/2021   BILITOT 0.4 06/15/2021   Lab Results  Component Value Date   HGBA1C 5.7 (H) 06/15/2021   Lab Results  Component Value Date   INSULIN 6.8 06/15/2021   Lab Results  Component Value Date   TSH 1.030 06/15/2021   Lab Results  Component Value Date   CHOL 199 06/15/2021   HDL 75 06/15/2021   LDLCALC 115 (H) 06/15/2021   TRIG 50 06/15/2021   Lab Results  Component Value Date   WBC 6.5 06/15/2021   HGB 13.9 06/15/2021   HCT 41.2 06/15/2021   MCV 86 06/15/2021   PLT 270 06/15/2021     Attestation Statements:    Reviewed by clinician on day of visit: allergies, medications, problem list, medical history, surgical history, family history, social history, and previous encounter notes.  Edmund Hilda, CMA, am acting as transcriptionist for [employer], DO.  I have reviewed the above documentation for accuracy and completeness, and I agree with the above. Carlye Grippe, D.O.  The 21st Century Cures Act was signed into law in 2016 which includes the topic of electronic health records.  This provides immediate access to information in MyChart.  This includes consultation notes, operative notes, office notes, lab results and pathology reports.  If you have any questions about what you read please let us know at your next visit so we can discuss your concerns and take corrective action if need be.  We are right here with you.

## 2021-06-17 ENCOUNTER — Encounter (INDEPENDENT_AMBULATORY_CARE_PROVIDER_SITE_OTHER): Payer: Self-pay

## 2021-06-18 ENCOUNTER — Other Ambulatory Visit: Payer: No Typology Code available for payment source

## 2021-06-22 ENCOUNTER — Telehealth (INDEPENDENT_AMBULATORY_CARE_PROVIDER_SITE_OTHER): Payer: No Typology Code available for payment source | Admitting: Psychology

## 2021-06-22 ENCOUNTER — Telehealth: Payer: Self-pay

## 2021-06-22 DIAGNOSIS — F5089 Other specified eating disorder: Secondary | ICD-10-CM

## 2021-06-22 DIAGNOSIS — F419 Anxiety disorder, unspecified: Secondary | ICD-10-CM

## 2021-06-22 NOTE — Telephone Encounter (Signed)
Notes scanned to referral 

## 2021-06-22 NOTE — Progress Notes (Unsigned)
°  Office: [phone]  /  Fax: [phone]    Date: July 05, 2021   Appointment Start Time: *** Duration: *** minutes Provider: Glennie Isle, Psy.D. Type of Session: Individual Therapy  Location of Patient: {gbptloc:23249} Location of Provider: Provider's Home (private office) Type of Contact: Telepsychological Visit via MyChart Video Visit  Session Content: Angela Randolph is a 61 y.o. female presenting for a follow-up appointment to address the previously established treatment goal of increasing coping skills.Today's appointment was a telepsychological visit due to COVID-19. [name] provided verbal consent for today's telepsychological appointment and she is aware she is responsible for securing confidentiality on her end of the session. Prior to proceeding with today's appointment, [name]'s physical location at the time of this appointment was obtained as well a phone number she could be reached at in the event of technical difficulties. [name] and this provider participated in today's telepsychological service.   This provider conducted a brief check-in. *** [name] was receptive to today's appointment as evidenced by openness to sharing, responsiveness to feedback, and {gbreceptiveness:23401}.  Mental Status Examination:  Appearance: {Appearance:22431} Behavior: {Behavior:22445} Mood: {gbmood:21757} Affect: {Affect:22436} Speech: {Speech:22432} Eye Contact: {Eye Contact:22433} Psychomotor Activity: {Motor Activity:22434} Gait: {gbgait:23404} Thought Process: {thought process:22448}  Thought Content/Perception: {disturbances:22451} Orientation: {Orientation:22437} Memory/Concentration: {gbcognition:22449} Insight: {Insight:22446} Judgment: {Insight:22446}  Interventions:  {Interventions for Progress Notes:23405}  DSM-5 Diagnosis(es): F50.89 Other Specified Feeding or Eating Disorder, Emotional Eating Behaviors and 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 [name] has demonstrated progress in her goal as evidenced by {gbtxprogress:22839}. [name] also {gbtxprogress2:22951}.  Plan: The next appointment will be scheduled in {gbweeks:21758}, which will be {gbtxmodality:23402}. The next session will focus on {Plan for Next Appointment:23400}.

## 2021-06-29 ENCOUNTER — Ambulatory Visit (INDEPENDENT_AMBULATORY_CARE_PROVIDER_SITE_OTHER): Payer: PRIVATE HEALTH INSURANCE | Admitting: Family Medicine

## 2021-07-01 ENCOUNTER — Ambulatory Visit (INDEPENDENT_AMBULATORY_CARE_PROVIDER_SITE_OTHER): Payer: No Typology Code available for payment source | Admitting: Family Medicine

## 2021-07-01 ENCOUNTER — Other Ambulatory Visit: Payer: Self-pay

## 2021-07-01 ENCOUNTER — Encounter (INDEPENDENT_AMBULATORY_CARE_PROVIDER_SITE_OTHER): Payer: Self-pay | Admitting: Family Medicine

## 2021-07-01 ENCOUNTER — Other Ambulatory Visit (HOSPITAL_COMMUNITY): Payer: Self-pay

## 2021-07-01 VITALS — BP 117/77 | HR 82 | Temp 98.0°F | Ht 64.0 in | Wt 179.0 lb

## 2021-07-01 DIAGNOSIS — R7303 Prediabetes: Secondary | ICD-10-CM

## 2021-07-01 DIAGNOSIS — E7849 Other hyperlipidemia: Secondary | ICD-10-CM

## 2021-07-01 DIAGNOSIS — E669 Obesity, unspecified: Secondary | ICD-10-CM

## 2021-07-01 DIAGNOSIS — Z6830 Body mass index (BMI) 30.0-30.9, adult: Secondary | ICD-10-CM

## 2021-07-01 DIAGNOSIS — F39 Unspecified mood [affective] disorder: Secondary | ICD-10-CM

## 2021-07-01 DIAGNOSIS — G479 Sleep disorder, unspecified: Secondary | ICD-10-CM

## 2021-07-01 DIAGNOSIS — Z9189 Other specified personal risk factors, not elsewhere classified: Secondary | ICD-10-CM

## 2021-07-01 DIAGNOSIS — E559 Vitamin D deficiency, unspecified: Secondary | ICD-10-CM

## 2021-07-01 DIAGNOSIS — E538 Deficiency of other specified B group vitamins: Secondary | ICD-10-CM

## 2021-07-01 MED ORDER — VITAMIN D (ERGOCALCIFEROL) 1.25 MG (50000 UNIT) PO CAPS
50000.0000 [IU] | ORAL_CAPSULE | ORAL | 0 refills | Status: DC
  Filled 2021-07-01: qty 4, 28d supply, fill #0

## 2021-07-05 NOTE — Progress Notes (Signed)
Chief Complaint:   OBESITY Angela Randolph is here to discuss her progress with her obesity treatment plan along with follow-up of her obesity related diagnoses. [name] is on the Category 2 Plan and states she is following her eating plan approximately 75% of the time. [name] states she is not exercising regularly.  Today's visit was #: 2 Starting weight: 179 lbs Starting date: 06/15/2021 Today's weight: 179 lbs Today's date: 07/01/2021 Total lbs lost to date: 0 Total lbs lost since last in-office visit: 0  Interim History: Angela Randolph is here today for her first follow-up office visit since starting the program with [place].  All blood work/ lab tests that were recently ordered by myself or an outside provider were reviewed with patient today per their request.   Extended time was spent counseling her on all new disease processes that were discovered or preexisting ones that are affected by BMI.  she understands that many of these abnormalities will need to monitored regularly along with the current treatment plan of prudent dietary changes, in which we are making each and every office visit, to improve these health parameters.  Angela Randolph is here for a follow up office visit.  We reviewed her meal plan and questions were answered.  Patient's food recall appears to be accurate and consistent with what is on plan when she is following it.   When eating on plan, her hunger and cravings are well controlled.   --> "I am so full and I'm amazed I don't crave anything."  She denies issues with the meal plan.  She says it is just difficult to get it all in at times.  Subjective:   1. Prediabetes Discussed labs with patient today.  A1c is 5.7, which is up from prior per patient. Worsening.  FI elevated at 6.8.  2. Other hyperlipidemia New.  Discussed labs with patient today.  [name] has hyperlipidemia and has been trying to improve her cholesterol levels with intensive lifestyle  modifications including a low saturated fat diet, exercise, and weight loss. She denies any chest pain, claudication, or myalgias.  She is not on any medications for this.  3. B12 deficiency Discussed labs with patient today.  [name] thinks she is on OTC vitamin B12 1000 mcg daily.  She says she is not taking it regularly.  4. Vitamin D deficiency New. Discussed labs with patient today.  Vitamin D level is not at goal.  She endorses fatigue.  She is not on a vitamin D supplement.    5. Sleeping difficulty Discussed labs with patient today.  Angela Randolph has an appointment with Dr. Radford Pax of Cardiology for Sleep Medicine evaluation on 07/14/2021   6. Mood disorder ([place])- emotional eating Discussed labs with patient today.  Angela Randolph met with Dr. Mallie Mussel.  She has a follow-up visit on 07/06/2021.  She says her visit went well and she feels it will be helpful to develop strategies for her emotional eating.  7. At risk for diabetes mellitus Angela Randolph is at higher than average risk for developing diabetes due to her mother's history of diabetes and her own history of prediabetes.     Assessment/Plan:    Medications Discontinued During This Encounter  Medication Reason   vitamin B-12 (CYANOCOBALAMIN) 1000 MCG tablet      Meds ordered this encounter  Medications   Vitamin D, Ergocalciferol, (DRISDOL) 1.25 MG (50000 UNIT) CAPS capsule    Sig: Take 1 capsule (50,000 Units total) by mouth every 7 (  seven) days.    Dispense:  4 capsule    Refill:  0    30 d supply;  ** OV for RF **   Do not send RF request     1. Prediabetes Not at goal.   She declines medication.   I counseled patient on pathophysiology of the disease process of Pre-DM.   Stressed importance of dietary and lifestyle modifications to result in weight loss as first line txmnt In addition, we discussed the risks and benefits of various medication options which can help [place] in the management of this disease process as well as with  weight loss.  Will consider starting one of these meds in future, as we will focus on prudent nutritional plan at this time.  Continue to decrease simple carbs; increase fiber and proteins -> follow meal plan  Handouts provided at pt's request after education provided.  All concerns/questions addressed.   Anticipatory guidance given.   - We will recheck A1c and fasting insulin level in approximately 3 months from last check, or as deemed appropriate.   2. Other hyperlipidemia LDL is elevated at 115, but HDL >60, which is great!. The 10-year ASCVD risk score (Arnett DK, et al., 2019) is: 2.2%  She will continue prudent nutritional plan and eventually increase exercise.  3. B12 deficiency Above goal.  She will discontinue her OTC B12.  Recheck in 3-4 months.  Counseling done on B12 deficiency - CBC stable.  Follow prudent nutritional plan to keep B12 within normal limits.  4. Vitamin D deficiency - I discussed the importance of vitamin D to the patient's health and well-being.  - I reviewed possible symptoms of low Vitamin D:  low energy, depressed mood, muscle aches, joint aches, osteoporosis etc. was reviewed with patient - low Vitamin D levels may be linked to an increased risk of cardiovascular events and even increased risk of cancers- such as colon and breast.  - ideal vitamin D levels reviewed with patient  - I recommend pt take 50,000 IU weekly prescription vit D - see script below   - Informed patient this may be a lifelong thing, and she was encouraged to continue to take the medicine until told otherwise.    - weight loss will likely improve availability of vitamin D, thus encouraged Dalene to continue with meal plan and their weight loss efforts to further improve this condition.  Thus, we will need to monitor levels regularly (every 3-4 mo on average) to keep levels within normal limits and prevent over supplementation. - pt's questions and concerns regarding this condition  addressed.    - Start Vitamin D, Ergocalciferol, (DRISDOL) 1.25 MG (50000 UNIT) CAPS capsule; Take 1 capsule (50,000 Units total) by mouth every 7 (seven) days.  Dispense: 4 capsule; Refill: 0  5. Sleeping difficulty Treatment plan per Sleep Medicine- keep appt.  Counseling done on adequate sleep hygiene and its importance to health and wellbeing.   6. Mood disorder ([place])- emotional eating Continue duloxetine per PCP.  Continue with Dr. Mallie Mussel.  Strategies discussed with patient.  Will follow.  7. At risk for diabetes mellitus Stephane was given approximately 23 minutes of diabetic education and counseling today. We discussed intensive lifestyle modifications today with an emphasis on weight loss as well as increasing exercise and decreasing simple carbohydrates in her diet. We also reviewed medication options with an emphasis on risk versus benefits of those discussed.  Repetitive spaced learning was employed today to elicit superior memory formation and behavioral  change.  8. Obesity with current BMI of 30.7  Tiari is currently in the action stage of change. As such, her goal is to continue with weight loss efforts. She has agreed to the Category 2 Plan.   Exercise goals:  As is.  Behavioral modification strategies: no skipping meals and planning for success.  Donnesha has agreed to follow-up with our clinic in 2 weeks. She was informed of the importance of frequent follow-up visits to maximize her success with intensive lifestyle modifications for her multiple health conditions.     Objective:   Blood pressure 117/77, pulse 82, temperature 98 F (36.7 C), height 5' 4" (1.626 m), weight 179 lb (81.2 kg), SpO2 97 %. Body mass index is 30.73 kg/m.  General: Cooperative, alert, well developed, in no acute distress. HEENT: Conjunctivae and lids unremarkable. Cardiovascular: Regular rhythm.  Lungs: Normal work of breathing. Neurologic: No focal deficits.   Lab Results  Component  Value Date   CREATININE 0.67 06/15/2021   BUN 15 06/15/2021   NA 144 06/15/2021   K 4.4 06/15/2021   CL 104 06/15/2021   CO2 25 06/15/2021   Lab Results  Component Value Date   ALT 20 06/15/2021   AST 19 06/15/2021   ALKPHOS 99 06/15/2021   BILITOT 0.4 06/15/2021   Lab Results  Component Value Date   HGBA1C 5.7 (H) 06/15/2021   Lab Results  Component Value Date   INSULIN 6.8 06/15/2021   Lab Results  Component Value Date   TSH 1.030 06/15/2021   Lab Results  Component Value Date   CHOL 199 06/15/2021   HDL 75 06/15/2021   LDLCALC 115 (H) 06/15/2021   TRIG 50 06/15/2021   Lab Results  Component Value Date   VD25OH 33.6 06/15/2021   Lab Results  Component Value Date   WBC 6.5 06/15/2021   HGB 13.9 06/15/2021   HCT 41.2 06/15/2021   MCV 86 06/15/2021   PLT 270 06/15/2021   Attestation Statements:   Reviewed by clinician on day of visit: allergies, medications, problem list, medical history, surgical history, family history, social history, and previous encounter notes.  I, Water quality scientist, CMA, am acting as Location manager for [employer], DO.  I have reviewed the above documentation for accuracy and completeness, and I agree with the above. Marjory Sneddon, D.O.  The [place] was signed into law in 2016 which includes the topic of electronic health records.  This provides immediate access to information in MyChart.  This includes consultation notes, operative notes, office notes, lab results and pathology reports.  If you have any questions about what you read please let us know at your next visit so we can discuss your concerns and take corrective action if need be.  We are right here with you.

## 2021-07-06 ENCOUNTER — Encounter (INDEPENDENT_AMBULATORY_CARE_PROVIDER_SITE_OTHER): Payer: Self-pay | Admitting: Family Medicine

## 2021-07-06 ENCOUNTER — Telehealth (INDEPENDENT_AMBULATORY_CARE_PROVIDER_SITE_OTHER): Payer: No Typology Code available for payment source | Admitting: Psychology

## 2021-07-06 DIAGNOSIS — F419 Anxiety disorder, unspecified: Secondary | ICD-10-CM | POA: Diagnosis not present

## 2021-07-06 DIAGNOSIS — F5089 Other specified eating disorder: Secondary | ICD-10-CM | POA: Diagnosis not present

## 2021-07-06 NOTE — Progress Notes (Signed)
?  Office: [phone]  /  Fax: [phone] ? ? ? ?Date: 3/28/20023   ?Appointment Start Time: 12:32pm ?Duration: 20 minutes ?Provider: Lawerance Cruel, Psy.D. ?Type of Session: Individual Therapy  ?Location of Patient: Work (private location) ?Location of Provider: Provider's Home (private office) ?Type of Contact: Telepsychological Visit via MyChart Video Visit ? ?Session Content: Naw is a 61 y.o. female presenting for a follow-up appointment to address the previously established treatment goal of increasing coping skills.Today's appointment was a telepsychological visit due to COVID-19. Chinaza provided verbal consent for today's telepsychological appointment and she is aware she is responsible for securing confidentiality on her end of the session. Prior to proceeding with today's appointment, Veryl's physical location at the time of this appointment was obtained as well a phone number she could be reached at in the event of technical difficulties. Aneshia and this provider participated in today's telepsychological service.  ? ?This provider conducted a brief check-in. Shakeita shared, "Things are going good. I'm a little disappointed about the weight loss." Further explored and processed. Reviewed triggers for emotional eating behaviors. She requested the handout be re-sent via e-mail. Psychoeducation regarding making better choices and engaging in portion control during the holidays/celebrations was provided as Nicolemarie expressed concern about her upcoming trip to the beach. More specifically, this provider discussed the following strategies: coming to meals hungry, but not starving; avoid filling up on appetizers; managing portion sizes; not completely depriving yourself; making the plate colorful (e.g., vegetables); pacing yourself (e.g., waiting 10 minutes before going back for seconds); taking advantage of the nutritious foods; practicing mindfulness; and staying hydrated. Overall, Marra was receptive  to today's appointment as evidenced by openness to sharing, responsiveness to feedback, and willingness to implement discussed strategies . ? ?Mental Status Examination:  ?Appearance: neat ?Behavior: appropriate to circumstances ?Mood: neutral ?Affect: mood congruent ?Speech: WNL ?Eye Contact: appropriate ?Psychomotor Activity: WNL ?Gait: unable to assess ?Thought Process: linear, logical, and goal directed and no evidence or endorsement of suicidal, homicidal, and self-harm ideation, plan and intent  ?Thought Content/Perception: no hallucinations, delusions, bizarre thinking or behavior endorsed or observed ?Orientation: AAOx4 ?Memory/Concentration: memory, attention, language, and fund of knowledge intact  ?Insight: fair ?Judgment: fair ? ?Interventions:  ?Conducted a brief chart review ?Provided empathic reflections and validation ?Reviewed content from the previous session ?Employed supportive psychotherapy interventions to facilitate reduced distress and to improve coping skills with identified stressors ?Psychoeducation provided regarding strategies for celebrations/holidays/vacations ? 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 ?Treatment Goal & Progress: During the initial appointment with this provider, the following treatment goal was established: increase coping skills. Linna has demonstrated progress in her goal as evidenced by increased awareness of hunger patterns and increased awareness of triggers for emotional eating behaviors. Hawa also continues to demonstrate willingness to engage in learned skill(s). ? ?Plan: The next appointment will be scheduled in two weeks, which will be via MyChart Video Visit. The next session will focus on working towards the established treatment goal. ? ?

## 2021-07-07 ENCOUNTER — Other Ambulatory Visit (HOSPITAL_COMMUNITY): Payer: Self-pay

## 2021-07-12 ENCOUNTER — Other Ambulatory Visit: Payer: Self-pay

## 2021-07-12 ENCOUNTER — Ambulatory Visit (INDEPENDENT_AMBULATORY_CARE_PROVIDER_SITE_OTHER): Payer: No Typology Code available for payment source | Admitting: Family Medicine

## 2021-07-12 ENCOUNTER — Encounter (INDEPENDENT_AMBULATORY_CARE_PROVIDER_SITE_OTHER): Payer: Self-pay

## 2021-07-12 VITALS — BP 131/80 | HR 74 | Temp 97.3°F | Ht 64.0 in | Wt 177.0 lb

## 2021-07-12 DIAGNOSIS — Z6830 Body mass index (BMI) 30.0-30.9, adult: Secondary | ICD-10-CM | POA: Diagnosis not present

## 2021-07-12 DIAGNOSIS — E559 Vitamin D deficiency, unspecified: Secondary | ICD-10-CM | POA: Diagnosis not present

## 2021-07-12 DIAGNOSIS — E669 Obesity, unspecified: Secondary | ICD-10-CM

## 2021-07-12 MED ORDER — VITAMIN D (ERGOCALCIFEROL) 1.25 MG (50000 UNIT) PO CAPS: 50000.0000 [IU] | ORAL_CAPSULE | ORAL | 0 refills | Status: DC

## 2021-07-14 ENCOUNTER — Encounter: Payer: Self-pay | Admitting: Cardiology

## 2021-07-14 ENCOUNTER — Ambulatory Visit: Payer: No Typology Code available for payment source | Admitting: Cardiology

## 2021-07-14 ENCOUNTER — Telehealth: Payer: Self-pay | Admitting: *Deleted

## 2021-07-14 ENCOUNTER — Other Ambulatory Visit: Payer: Self-pay

## 2021-07-14 VITALS — BP 110/68 | HR 85 | Ht 64.5 in | Wt 179.0 lb

## 2021-07-14 DIAGNOSIS — G4719 Other hypersomnia: Secondary | ICD-10-CM | POA: Diagnosis not present

## 2021-07-14 DIAGNOSIS — R072 Precordial pain: Secondary | ICD-10-CM | POA: Diagnosis not present

## 2021-07-14 DIAGNOSIS — R0789 Other chest pain: Secondary | ICD-10-CM | POA: Diagnosis not present

## 2021-07-14 MED ORDER — METOPROLOL TARTRATE 100 MG PO TABS: 100.0000 mg | ORAL_TABLET | Freq: Once | ORAL | 0 refills | Status: AC

## 2021-07-14 NOTE — Progress Notes (Addendum)
? ?Cardiology CONSULT Note   ? ?Date:  07/14/2021  ? ?ID:  Angela Congressynthia Hinshaw Heinicke, DOB 05/03/1960, MRN [id number] ? ?PCP:  Annamaria HellingWein, Robert, MD  ?Cardiologist:  Armanda Magicraci Kostas Marrow, MD  ? ?Chief Complaint  ?Patient presents with  ? New Patient (Initial Visit)  ?  Fatigue  ? ? ?History of Present Illness:  ?Angela Randolph is a 61 y.o. female who is being seen today for the evaluation of malaise and fatigue at the request of Annamaria HellingWein, Robert, MD. ? ?This is a 61 year old female with a history of obesity and prediabetes who recently saw her PCP and complained of feeling tired during the day.  She has been seeing healthy weight and wellness and they felt she might need a sleep study. She has been feeling fatigued but was found to have a low Vit D level and just started Vitamin D.  She feels tired when she wakes up I the am.  She goes to bed at 9:30pm and gets up at 5:20am and still feels tired.  She gets sleepy after lunch and has to drink caffeine.  She denies any am headaches.  She has been told she snores and denies any awakenings gasping for breath.  She denies any exertional chest pain but has some nonexertional chest tightness that goes away with time.  She has a hx of esophageal dilatation in the past.  She denies any DOE, LE edema, PND, orthopnea, dizziness or palpitations.  ? ?Past Medical History:  ?Diagnosis Date  ? Acute bronchitis   ? Acute cystitis   ? Anxiety   ? Body mass index (BMI) 30.0-30.9, adult   ? Constipation   ? Gastritis   ? Herpes labialis   ? Joint pain   ? Lesion of vulva   ? Malaise and fatigue   ? Obesity   ? Overweight   ? Pre-diabetes   ? ? ?Past Surgical History:  ?Procedure Laterality Date  ? BACK SURGERY    ? CESAREAN SECTION    ? right carpal tunel    ? ? ?Current Medications: ?Current Meds  ?Medication Sig  ? Cetirizine HCl (ZYRTEC ALLERGY) 10 MG CAPS Take 1 capsule by mouth daily.  ? DULoxetine (CYMBALTA) 30 MG capsule Take 30 mg by mouth daily.  ? Multiple Vitamins-Minerals (MULTIVITAMIN  WITH MINERALS) tablet Take 1 tablet by mouth daily.  ? pantoprazole (PROTONIX) 20 MG tablet Take 20 mg by mouth. As needed  ? valACYclovir (VALTREX) 500 MG tablet Take 500 mg by mouth as needed.  ? Vitamin D, Ergocalciferol, (DRISDOL) 1.25 MG (50000 UNIT) CAPS capsule Take 1 capsule (50,000 Units total) by mouth every 7 (seven) days.  ? ? ?Allergies:   Barley grass, Clindamycin/lincomycin, Rice, and Lexapro [escitalopram]  ? ?Social History  ? ?Socioeconomic History  ? Marital status: Married  ?  Spouse name: Levander CampionRay Wadsworth  ? Number of children: Not on file  ? Years of education: Not on file  ? Highest education level: Not on file  ?Occupational History  ? Occupation: Publishing rights manager[employer]  ?Tobacco Use  ? Smoking status: Never  ? Smokeless tobacco: Never  ?Vaping Use  ? Vaping Use: Never used  ?Substance and Sexual Activity  ? Alcohol use: Yes  ?  Comment: monthly  ? Drug use: No  ? Sexual activity: Not on file  ?Other Topics Concern  ? Not on file  ?Social History Narrative  ? Not on file  ? ?Social Determinants of Health  ? ?Financial Resource Strain: Not  on file  ?Food Insecurity: Not on file  ?Transportation Needs: Not on file  ?Physical Activity: Not on file  ?Stress: Not on file  ?Social Connections: Not on file  ?  ? ?Family History:  The patient's family history includes Anxiety disorder in her father; Atrial fibrillation in her brother; Depression in her father; Diabetes in her maternal grandfather and mother; Heart disease in her maternal grandfather; Hypertension in her maternal grandfather and mother; Kidney disease in her mother; Obesity in her mother; Stroke in her mother; Sudden death in her father.  ? ?ROS:   ?Please see the history of present illness.    ?ROS All other systems reviewed and are negative. ? ?   ? View : No data to display.  ?  ?  ?  ? ? ? ? ? ?PHYSICAL EXAM:   ?VS:  BP 110/68   Pulse 85   Ht 5' 4.5" (1.638 m)   Wt 179 lb (81.2 kg)   SpO2 97%   BMI 30.25 kg/m?    ?GEN: Well nourished, well  developed, in no acute distress  ?HEENT: normal  ?Neck: no JVD, carotid bruits, or masses ?Cardiac: RRR; no murmurs, rubs, or gallops,no edema.  Intact distal pulses bilaterally.  ?Respiratory:  clear to auscultation bilaterally, normal work of breathing ?GI: soft, nontender, nondistended, + BS ?MS: no deformity or atrophy  ?Skin: warm and dry, no rash ?Neuro:  Alert and Oriented x 3, Strength and sensation are intact ?Psych: euthymic mood, full affect ? ?Wt Readings from Last 3 Encounters:  ?07/14/21 179 lb (81.2 kg)  ?07/12/21 177 lb (80.3 kg)  ?07/01/21 179 lb (81.2 kg)  ?  ? ? ?Studies/Labs Reviewed:  ? ?EKG:  EKG is not ordered today.   ? ?Recent Labs: ?06/15/2021: ALT 20; BUN 15; Creatinine, Ser 0.67; Hemoglobin 13.9; Platelets 270; Potassium 4.4; Sodium 144; TSH 1.030  ? ?Lipid Panel ?   ?Component Value Date/Time  ? CHOL 199 06/15/2021 0927  ? TRIG 50 06/15/2021 0927  ? HDL 75 06/15/2021 0927  ? LDLCALC 115 (H) 06/15/2021 4854  ? ? ?Additional studies/ records that were reviewed today include:  ?Office visit notes from PCP ? ? ? ?ASSESSMENT:   ? ?1. Excessive daytime sleepiness   ?2. Chest tightness   ? ? ? ?PLAN:  ?In order of problems listed above: ? ?Excessive daytime sleepiness ?-Given her obesity and body habitus I suspect she may have underlying obstructive sleep apnea ?-I have recommended an [place] sleep study ? ?2.  Chest tightness ?-somewhat atypical in that it is nonexertional and will eventually subside on its own ?-her only CRFs are remote fm hx of CAD in grandparents, post menopausal state and obesity ?-She has a hx of esophageal dilatation in the past so this could be gerd ?-I will get a coronary CTA to define coronary anatomy ? ?Time Spent: ?20 minutes total time of encounter, including 15 minutes spent in face-to-face patient care on the date of this encounter. This time includes coordination of care and counseling regarding above mentioned problem list. Remainder of non-face-to-face time  involved reviewing chart documents/testing relevant to the patient encounter and documentation in the medical record. I have independently reviewed documentation from referring provider ? ?Medication Adjustments/Labs and Tests Ordered: ?Current medicines are reviewed at length with the patient today.  Concerns regarding medicines are outlined above.  Medication changes, Labs and Tests ordered today are listed in the Patient Instructions below. ? ?There are no Patient Instructions on file  for this visit. ? ? ?Signed, ?Armanda Magic, MD  ?07/14/2021 1:02 PM    ?[facility] ?[address] [place], [place], [place]  [address] ?Phone: [phone]; Fax: [phone]  ? ?

## 2021-07-14 NOTE — Patient Instructions (Addendum)
Medication Instructions:  ?Your physician recommends that you continue on your current medications as directed. Please refer to the Current Medication list given to you today. ? ?*If you need a refill on your cardiac medications before your next appointment, please call your pharmacy* ? ?Lab Work: ?TODAY: BMET ?If you have labs (blood work) drawn today and your tests are completely normal, you will receive your results only by: ?MyChart Message (if you have MyChart) OR ?A paper copy in the mail ?If you have any lab test that is abnormal or we need to change your treatment, we will call you to review the results. ? ? ?Testing/Procedures: ?Your physician has recommended that you have a sleep study. This test records several body functions during sleep, including: brain activity, eye movement, oxygen and carbon dioxide blood levels, heart rate and rhythm, breathing rate and rhythm, the flow of air through your mouth and nose, snoring, body muscle movements, and chest and belly movement. ? ?Your physician has recommended that you have a coronary CTA scan. Please see next page for further instructions.  ? ?Follow-Up: ?At [facility], you and your health needs are our priority.  As part of our continuing mission to provide you with exceptional heart care, we have created designated Provider Care Teams.  These Care Teams include your primary Cardiologist (physician) and Advanced Practice Providers (APPs -  Physician Assistants and Nurse Practitioners) who all work together to provide you with the care you need, when you need it.  ? ?Follow up with Dr. Radford Pax as needed based on results of testing.  ? ? ?Other Instructions ? ? ?Your cardiac CT will be scheduled at one of the below locations:  ? ?[facility] ?[address] ?[place], [place] [address] ?[phone] ? ?If scheduled at [facility], please arrive at the [facility] and Children's Entrance (Entrance C2) of [facility] 30 minutes prior  to test start time. ?You can use the FREE valet parking offered at entrance C (encouraged to control the heart rate for the test)  ?Proceed to the [facility] Radiology Department (first floor) to check-in and test prep. ? ?All radiology patients and guests should use entrance C2 at [facility], accessed from [facility], even though the hospital's physical address listed is [address]. ? ? ? ?Please follow these instructions carefully (unless otherwise directed): ? ? ?On the Night Before the Test: ?Be sure to Drink plenty of water. ?Do not consume any caffeinated/decaffeinated beverages or chocolate 12 hours prior to your test. ?Do not take any antihistamines 12 hours prior to your test. ? ?On the Day of the Test: ?Drink plenty of water until 1 hour prior to the test. ?Do not eat any food 4 hours prior to the test. ?You may take your regular medications prior to the test.  ?Take metoprolol (Lopressor) two hours prior to test. ?FEMALES- please wear underwire-free bra if available, avoid dresses & tight clothing ? ?After the Test: ?Drink plenty of water. ?After receiving IV contrast, you may experience a mild flushed feeling. This is normal. ?On occasion, you may experience a mild rash up to 24 hours after the test. This is not dangerous. If this occurs, you can take Benadryl 25 mg and increase your fluid intake. ?If you experience trouble breathing, this can be serious. If it is severe call 911 IMMEDIATELY. If it is mild, please call our office. ? ?We will call to schedule your test 2-4 weeks out understanding  that some insurance companies will need an authorization prior to the service being performed.  ? ?For non-scheduling related questions, please contact the cardiac imaging nurse navigator should you have any questions/concerns: ?Marchia Bond, Cardiac Imaging Nurse Navigator ?Gordy Clement, Cardiac Imaging Nurse Navigator ?[facility] ?Direct Office  Dial: [phone]  ? ?For scheduling needs, including cancellations and rescheduling, please call [place], [phone].   ?

## 2021-07-14 NOTE — Telephone Encounter (Signed)
SET UP DATE 07/14/21 ?

## 2021-07-14 NOTE — Telephone Encounter (Signed)
Pt came in today to see Dr. Mayford Knife, who ordered an [place] sleep study for the pt. Pt aware to not open the box until we call with the PIN# and ok to proceed. Pt thanked me the help today.  ?

## 2021-07-14 NOTE — Addendum Note (Signed)
Addended by: Theresia Majors on: 07/14/2021 01:18 PM ? ? Modules accepted: Orders ? ?

## 2021-07-15 LAB — BASIC METABOLIC PANEL
BUN/Creatinine Ratio: 25 (ref 12–28)
BUN: 20 mg/dL (ref 8–27)
CO2: 23 mmol/L (ref 20–29)
Calcium: 9.5 mg/dL (ref 8.7–10.3)
Chloride: 102 mmol/L (ref 96–106)
Creatinine, Ser: 0.81 mg/dL (ref 0.57–1.00)
Glucose: 99 mg/dL (ref 70–99)
Potassium: 4.3 mmol/L (ref 3.5–5.2)
Sodium: 140 mmol/L (ref 134–144)
eGFR: 83 mL/min/{1.73_m2} (ref >59)

## 2021-07-15 NOTE — Progress Notes (Signed)
? ? ? ?Chief Complaint:  ? ?OBESITY ?Angela Randolph is here to discuss her progress with her obesity treatment plan along with follow-up of her obesity related diagnoses. Angela Randolph is on the Category 2 Plan and states she is following her eating plan approximately 75% of the time. Angela Randolph states she is not exercising regularly at this time. ? ?Today's visit was #: 3 ?Starting weight: 179 lbs ?Starting date: 06/15/2021 ?Today's weight: 177 lbs ?Today's date: 07/12/2021 ?Total lbs lost to date: 2 lbs ?Total lbs lost since last in-office visit: 2 lbs ? ?Interim History: Angela Randolph did a better job eating all her food on a regular basis.  She is journaling everything she puts in her mouth because that helps her stay on track.  Denies hunger cravings.  No issues with the plan. ? ?Subjective:  ? ?1. Vitamin D deficiency ?Started at last office visit.  Tolerating well.  No issues or concerns. ? ?Assessment/Plan:  ?No orders of the defined types were placed in this encounter. ? ? ?Medications Discontinued During This Encounter  ?Medication Reason  ? Vitamin D, Ergocalciferol, (DRISDOL) 1.25 MG (50000 UNIT) CAPS capsule Reorder  ?  ? ?Meds ordered this encounter  ?Medications  ? Vitamin D, Ergocalciferol, (DRISDOL) 1.25 MG (50000 UNIT) CAPS capsule  ?  Sig: Take 1 capsule (50,000 Units total) by mouth every 7 (seven) days.  ?  Dispense:  4 capsule  ?  Refill:  0  ?  30 d supply;  ** OV for RF **   Do not send RF request  ?  ? ?1. Vitamin D deficiency ?Low Vitamin D level contributes to fatigue and are associated with obesity, breast, and colon cancer. She agrees to continue to take prescription Vitamin D @50 ,000 IU every week and will follow-up for routine testing of Vitamin D, at least 2-3 times per year to avoid over-replacement. ? ?- Refill Vitamin D, Ergocalciferol, (DRISDOL) 1.25 MG (50000 UNIT) CAPS capsule; Take 1 capsule (50,000 Units total) by mouth every 7 (seven) days.  Dispense: 4 capsule; Refill: 0 ? ?2. Obesity with current  BMI of 30.4 ? ?Angela Randolph is currently in the action stage of change. As such, her goal is to continue with weight loss efforts. She has agreed to the Category 2 Plan with protein equivalents.  ? ?Exercise goals:  Start walking for 20 minutes 3 days per week. ? ?Behavioral modification strategies: meal planning and cooking strategies, keeping healthy foods in the home, and ways to avoid boredom eating. ? ?Angela Randolph has agreed to follow-up with our clinic in 2-3 weeks. She was informed of the importance of frequent follow-up visits to maximize her success with intensive lifestyle modifications for her multiple health conditions.  ? ?Objective:  ? ?Blood pressure 131/80, pulse 74, temperature (!) 97.3 ?F (36.3 ?C), height 5\' 4"  (1.626 m), weight 177 lb (80.3 kg), SpO2 99 %. ?Body mass index is 30.38 kg/m?. ? ?General: Cooperative, alert, well developed, in no acute distress. ?HEENT: Conjunctivae and lids unremarkable. ?Cardiovascular: Regular rhythm.  ?Lungs: Normal work of breathing. ?Neurologic: No focal deficits.  ? ?Lab Results  ?Component Value Date  ? CREATININE 0.81 07/14/2021  ? BUN 20 07/14/2021  ? NA 140 07/14/2021  ? K 4.3 07/14/2021  ? CL 102 07/14/2021  ? CO2 23 07/14/2021  ? ?Lab Results  ?Component Value Date  ? ALT 20 06/15/2021  ? AST 19 06/15/2021  ? ALKPHOS 99 06/15/2021  ? BILITOT 0.4 06/15/2021  ? ?Lab Results  ?Component Value Date  ?  HGBA1C 5.7 (H) 06/15/2021  ? HGBA1C 5.7 03/24/2021  ? ?Lab Results  ?Component Value Date  ? INSULIN 6.8 06/15/2021  ? ?Lab Results  ?Component Value Date  ? TSH 1.030 06/15/2021  ? ?Lab Results  ?Component Value Date  ? CHOL 199 06/15/2021  ? HDL 75 06/15/2021  ? LDLCALC 115 (H) 06/15/2021  ? TRIG 50 06/15/2021  ? ?Lab Results  ?Component Value Date  ? VD25OH 33.6 06/15/2021  ? ?Lab Results  ?Component Value Date  ? WBC 6.5 06/15/2021  ? HGB 13.9 06/15/2021  ? HCT 41.2 06/15/2021  ? MCV 86 06/15/2021  ? PLT 270 06/15/2021  ? ?Attestation Statements:  ? ?Reviewed by  clinician on day of visit: allergies, medications, problem list, medical history, surgical history, family history, social history, and previous encounter notes. ? ?I, Insurance claims handler, CMA, am acting as transcriptionist for [employer], DO. ? ?I have reviewed the above documentation for accuracy and completeness, and I agree with the above. Carlye Grippe, D.O. ? ?The 21st Century Cures Act was signed into law in 2016 which includes the topic of electronic health records.  This provides immediate access to information in MyChart.  This includes consultation notes, operative notes, office notes, lab results and pathology reports.  If you have any questions about what you read please let us know at your next visit so we can discuss your concerns and take corrective action if need be.  We are right here with you. ? ?

## 2021-07-19 NOTE — Telephone Encounter (Signed)
PER RACHEL.U 07-19-21.  No PA REQUIRED ?

## 2021-07-19 NOTE — Telephone Encounter (Signed)
Called and made the patient aware that she may proceed with the [facility] Sleep Study. PIN # provided to the patient. Patient made aware that she will be contacted after the test has been read with the results and any recommendations. Patient verbalized understanding and thanked me for the call.  ? ?Pt states she will do sleep study this week ?

## 2021-07-20 ENCOUNTER — Telehealth (INDEPENDENT_AMBULATORY_CARE_PROVIDER_SITE_OTHER): Payer: No Typology Code available for payment source | Admitting: Psychology

## 2021-07-20 DIAGNOSIS — F5089 Other specified eating disorder: Secondary | ICD-10-CM

## 2021-07-20 DIAGNOSIS — F419 Anxiety disorder, unspecified: Secondary | ICD-10-CM

## 2021-07-20 NOTE — Progress Notes (Incomplete)
?  Office: [phone]  /  Fax: [phone] ? ? ? ?Date: 08/03/2021   ?Appointment Start Time: *** ?Duration: *** minutes ?Provider: Lawerance Cruel, Psy.D. ?Type of Session: Individual Therapy  ?Location of Patient: {gbptloc:23249} (private location) ?Location of Provider: Provider's Home (private office) ?Type of Contact: Telepsychological Visit via MyChart Video Visit ? ?Session Content: Angela Randolph is a 61 y.o. female presenting for a follow-up appointment to address the previously established treatment goal of increasing coping skills.Today's appointment was a telepsychological visit due to COVID-19. Angela Randolph provided verbal consent for today's telepsychological appointment and she is aware she is responsible for securing confidentiality on her end of the session. Prior to proceeding with today's appointment, Angela Randolph's physical location at the time of this appointment was obtained as well a phone number she could be reached at in the event of technical difficulties. Angela Randolph and this provider participated in today's telepsychological service.  ? ?This provider conducted a brief check-in. *** Angela Randolph was receptive to today's appointment as evidenced by openness to sharing, responsiveness to feedback, and {gbreceptiveness:23401}. ? ?Mental Status Examination:  ?Appearance: {Appearance:22431} ?Behavior: {Behavior:22445} ?Mood: {gbmood:21757} ?Affect: {Affect:22436} ?Speech: {Speech:22432} ?Eye Contact: {Eye Contact:22433} ?Psychomotor Activity: {Motor Activity:22434} ?Gait: {gbgait:23404} ?Thought Process: {thought process:22448}  ?Thought Content/Perception: {disturbances:22451} ?Orientation: {Orientation:22437} ?Memory/Concentration: {gbcognition:22449} ?Insight: {Insight:22446} ?Judgment: {Insight:22446} ? ?Interventions:  ?{Interventions for Progress Notes:23405} ? 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 ?Treatment  Goal & Progress: During the initial appointment with this provider, the following treatment goal was established: increase coping skills. [name] has demonstrated progress in her goal as evidenced by {gbtxprogress:22839}. [name] also {gbtxprogress2:22951}. ? ?Plan: The next appointment will be scheduled in {gbweeks:21758}, which will be via MyChart Video Visit. The next session will focus on {Plan for Next Appointment:23400}. ? ?

## 2021-07-22 ENCOUNTER — Encounter (INDEPENDENT_AMBULATORY_CARE_PROVIDER_SITE_OTHER): Payer: No Typology Code available for payment source | Admitting: Cardiology

## 2021-07-22 DIAGNOSIS — F39 Unspecified mood [affective] disorder: Secondary | ICD-10-CM | POA: Diagnosis not present

## 2021-07-23 ENCOUNTER — Other Ambulatory Visit: Payer: Self-pay

## 2021-07-23 ENCOUNTER — Ambulatory Visit: Payer: No Typology Code available for payment source

## 2021-07-23 DIAGNOSIS — G4719 Other hypersomnia: Secondary | ICD-10-CM

## 2021-07-23 DIAGNOSIS — R072 Precordial pain: Secondary | ICD-10-CM

## 2021-07-23 DIAGNOSIS — R0789 Other chest pain: Secondary | ICD-10-CM

## 2021-07-23 NOTE — Procedures (Signed)
? ?  Sleep Study Report ?Patient Information ?Study Date: 07/22/21 ?Patient Name: Angela Randolph ?Patient ID: [id number] ?Birth Date: 04-Jul-2060 ?Age: 61 ?Gender: Female ?BMI: 30.5 (W=178 lb, H=5' 4'') ?Referring Physician: Armanda Magic, MD ? ?TEST DESCRIPTION: Home sleep apnea testing was completed using the WatchPat, a Type 1 device, utilizing ?peripheral arterial tonometry (PAT), chest movement, actigraphy, pulse oximetry, pulse rate, body position and snore. ?AHI was calculated with apnea and hypopnea using valid sleep time as the denominator. RDI includes apneas, ?hypopneas, and RERAs. The data acquired and the scoring of sleep and all associated events were performed in ?accordance with the recommended standards and specifications as outlined in the AASM Manual for the Scoring of ?Sleep and Associated Events 2.2.0 (2015). ? ?FINDINGS: ?1. No evidence of Obstructive Sleep Apnea with AHI 2.9/hr. ?2. No Central Sleep Apnea. ?3. Oxygen desaturations as low as 89%. ?4. Minimal snoring was present. O2 sats were < 88% for 0 minutes. ?5. Total sleep time was 6 hrs and 40 min. ?6. 20.7% of total sleep time was spent in REM sleep. ?7. Normal sleep onset latency at 19 min. ?8. Prolonged REM sleep onset latency at 180 min. ?9. Total awakenings were 5. ? ?DIAGNOSIS: ?Normal study with no significant sleep disordered breathing. ? ?RECOMMENDATIONS: ?1. Normal study with no significant sleep disordered breathing. ? ?2. Healthy sleep recommendations include: adequate nightly sleep (normal 7-9 hrs/night), avoidance of caffeine after ?noon and alcohol near bedtime, and maintaining a sleep environment that is cool, dark and quiet. ? ?3. Weight loss for overweight patients is recommended. ? ?4. Snoring recommendations include: weight loss where appropriate, side sleeping, and avoidance of alcohol before ?bed. ? ?5. Operation of motor vehicle or dangerous equipment must be avoided when feeling drowsy, excessively sleepy, or ?mentally  fatigued. ? ?6. An ENT consultation which may be useful for specific causes of and possible treatment of bothersome snoring . ? ?7. Weight loss may be of benefit in reducing the severity of snoring.  ? ?Signature: ?Electronically Signed: 07/23/21 ?Armanda Magic, MD; [facility]; Diplomat, American Board of Sleep Medicine ? ?

## 2021-07-27 ENCOUNTER — Encounter: Payer: Self-pay | Admitting: *Deleted

## 2021-07-27 NOTE — Telephone Encounter (Signed)
-----   Message from Gaynelle Cage, CMA sent at 07/23/2021  3:11 PM EDT ----- ? ?----- Message ----- ?From: Quintella Reichert, MD ?Sent: 07/23/2021  11:08 AM EDT ?To: Cv Div Sleep Studies ? ?Normal home sleep study so in lab PSG will be ordered  ? ?

## 2021-07-27 NOTE — Telephone Encounter (Signed)
The patient has been notified of the result and verbalized understanding.  All questions (if any) were answered. ?Angela Randolph, [place] 07/27/2021 11:02 PM   ?Pt is aware and agreeable to normal results  ?

## 2021-07-28 ENCOUNTER — Ambulatory Visit (INDEPENDENT_AMBULATORY_CARE_PROVIDER_SITE_OTHER): Payer: No Typology Code available for payment source | Admitting: Physician Assistant

## 2021-07-28 ENCOUNTER — Encounter (INDEPENDENT_AMBULATORY_CARE_PROVIDER_SITE_OTHER): Payer: Self-pay | Admitting: Physician Assistant

## 2021-07-28 VITALS — BP 110/66 | HR 82 | Temp 97.8°F | Ht 64.0 in | Wt 176.0 lb

## 2021-07-28 DIAGNOSIS — Z6830 Body mass index (BMI) 30.0-30.9, adult: Secondary | ICD-10-CM | POA: Diagnosis not present

## 2021-07-28 DIAGNOSIS — E669 Obesity, unspecified: Secondary | ICD-10-CM

## 2021-07-28 DIAGNOSIS — E559 Vitamin D deficiency, unspecified: Secondary | ICD-10-CM

## 2021-07-29 ENCOUNTER — Ambulatory Visit (HOSPITAL_COMMUNITY): Payer: No Typology Code available for payment source

## 2021-07-29 NOTE — Progress Notes (Signed)
? ? ? ?Chief Complaint:  ? ?OBESITY ?Angela Randolph is here to discuss her progress with her obesity treatment plan along with follow-up of her obesity related diagnoses. Angela Randolph is on the Category 2 Plan plus protein equivalent and states she is following her eating plan approximately 75% of the time. Angela Randolph states she is walking for 15 minutes 3 times per week. ? ?Today's visit was #: 4 ?Starting weight: 179 lbs ?Starting date: 06/15/2021 ?Today's weight: 176 lbs ?Today's date: 07/28/2021 ?Total lbs lost to date: 3 lbs ?Total lbs lost since last in-office visit: 1 lb ? ?Interim History: Angela Randolph reports that the last 2 days she has not eaten on plan. Lack of prep is her biggest issue. She had a small pizza last night after her pork loin was not ready.  ? ?Subjective:  ? ?1. Vitamin D deficiency ?Angela Randolph is currently on Vitamin D and she is taking as prescribed. She notes some fatigue, especially after work.  ? ?Assessment/Plan:  ? ?1. Vitamin D deficiency ?Low Vitamin D level contributes to fatigue and are associated with obesity, breast, and colon cancer. Angela Randolph will continue prescription Vitamin D and she will follow-up for routine testing of Vitamin D, at least 2-3 times per year to avoid over-replacement. ? ?2. Obesity with current BMI of 30.4 ?Angela Randolph is currently in the action stage of change. As such, her goal is to continue with weight loss efforts. She has agreed to the Category 2 Plan.  ? ?We discussed convenient meal/high protein options. Recipes were given today. ? ?Exercise goals:  As is.  ? ?Behavioral modification strategies: meal planning and cooking strategies and planning for success. ? ?Angela Randolph has agreed to follow-up with our clinic in 2 weeks. She was informed of the importance of frequent follow-up visits to maximize her success with intensive lifestyle modifications for her multiple health conditions.  ? ?Objective:  ? ?Blood pressure 110/66, pulse 82, temperature 97.8 ?F (36.6 ?C), height 5\' 4"   (1.626 m), weight 176 lb (79.8 kg), SpO2 97 %. ?Body mass index is 30.21 kg/m?. ? ?General: Cooperative, alert, well developed, in no acute distress. ?HEENT: Conjunctivae and lids unremarkable. ?Cardiovascular: Regular rhythm.  ?Lungs: Normal work of breathing. ?Neurologic: No focal deficits.  ? ?Lab Results  ?Component Value Date  ? CREATININE 0.81 07/14/2021  ? BUN 20 07/14/2021  ? NA 140 07/14/2021  ? K 4.3 07/14/2021  ? CL 102 07/14/2021  ? CO2 23 07/14/2021  ? ?Lab Results  ?Component Value Date  ? ALT 20 06/15/2021  ? AST 19 06/15/2021  ? ALKPHOS 99 06/15/2021  ? BILITOT 0.4 06/15/2021  ? ?Lab Results  ?Component Value Date  ? HGBA1C 5.7 (H) 06/15/2021  ? HGBA1C 5.7 03/24/2021  ? ?Lab Results  ?Component Value Date  ? INSULIN 6.8 06/15/2021  ? ?Lab Results  ?Component Value Date  ? TSH 1.030 06/15/2021  ? ?Lab Results  ?Component Value Date  ? CHOL 199 06/15/2021  ? HDL 75 06/15/2021  ? LDLCALC 115 (H) 06/15/2021  ? TRIG 50 06/15/2021  ? ?Lab Results  ?Component Value Date  ? VD25OH 33.6 06/15/2021  ? ?Lab Results  ?Component Value Date  ? WBC 6.5 06/15/2021  ? HGB 13.9 06/15/2021  ? HCT 41.2 06/15/2021  ? MCV 86 06/15/2021  ? PLT 270 06/15/2021  ? ?No results found for: IRON, TIBC, FERRITIN ? ?Attestation Statements:  ? ?Reviewed by clinician on day of visit: allergies, medications, problem list, medical history, surgical history, family history, social history, and  previous encounter notes. ? ?Time spent on visit including pre-visit chart review and post-visit care and charting was 32 minutes.  ? ?I, Sindy Messing, am acting as Energy manager for [employer], PA-C. ? ?I have reviewed the above documentation for accuracy and completeness, and I agree with the above. Alois Cliche, PA-C ? ?

## 2021-08-02 NOTE — Progress Notes (Unsigned)
?  Office: [phone]  /  Fax: [phone] ? ? ? ?Date: 08/10/2021   ?Appointment Start Time: *** ?Duration: *** minutes ?Provider: Lawerance Cruel, Psy.D. ?Type of Session: Individual Therapy  ?Location of Patient: {gbptloc:23249} (private location) ?Location of Provider: Provider's Home (private office) ?Type of Contact: Telepsychological Visit via MyChart Video Visit ? ?Session Content: This provider called [name] at 9:32am as she did not present for today's appointment. She stated she was attempting to join. As such, today's appointment was initiated *** minutes late.Angela Randolph is a 61 y.o. female presenting for a follow-up appointment to address the previously established treatment goal of increasing coping skills.Today's appointment was a telepsychological visit due to COVID-19. Liyla provided verbal consent for today's telepsychological appointment and she is aware she is responsible for securing confidentiality on her end of the session. Prior to proceeding with today's appointment, [name]'s physical location at the time of this appointment was obtained as well a phone number she could be reached at in the event of technical difficulties. [name] and this provider participated in today's telepsychological service.  ? ?This provider conducted a brief check-in. *** Aunesti was receptive to today's appointment as evidenced by openness to sharing, responsiveness to feedback, and {gbreceptiveness:23401}. ? ?Mental Status Examination:  ?Appearance: {Appearance:22431} ?Behavior: {Behavior:22445} ?Mood: {gbmood:21757} ?Affect: {Affect:22436} ?Speech: {Speech:22432} ?Eye Contact: {Eye Contact:22433} ?Psychomotor Activity: {Motor Activity:22434} ?Gait: {gbgait:23404} ?Thought Process: {thought process:22448}  ?Thought Content/Perception: {disturbances:22451} ?Orientation: {Orientation:22437} ?Memory/Concentration: {gbcognition:22449} ?Insight: {Insight:22446} ?Judgment: {Insight:22446} ? ?Interventions:   ?{Interventions for Progress Notes:23405} ? 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 ?Treatment Goal & Progress: During the initial appointment with this provider, the following treatment goal was established: increase coping skills. Curtina has demonstrated progress in her goal as evidenced by {gbtxprogress:22839}. [name] also {gbtxprogress2:22951}. ? ?Plan: The next appointment is scheduled for *** at ***, which will be via MyChart Video Visit. The next session will focus on {Plan for Next Appointment:23400}. ? ?

## 2021-08-03 ENCOUNTER — Telehealth (INDEPENDENT_AMBULATORY_CARE_PROVIDER_SITE_OTHER): Payer: No Typology Code available for payment source | Admitting: Psychology

## 2021-08-06 ENCOUNTER — Telehealth: Payer: Self-pay | Admitting: *Deleted

## 2021-08-06 NOTE — Telephone Encounter (Signed)
The patient has been notified of the result via her mychart.   ?Marolyn Hammock, [place] 08/06/2021 9:47 PM   ?

## 2021-08-06 NOTE — Telephone Encounter (Signed)
-----   Message from Wanda M Waddell, CMA sent at 07/23/2021  3:11 PM EDT ----- ? ?----- Message ----- ?From: Turner, Traci R, MD ?Sent: 07/23/2021  11:08 AM EDT ?To: Cv Div Sleep Studies ? ?Normal home sleep study so in lab PSG will be ordered  ? ?

## 2021-08-06 NOTE — Telephone Encounter (Signed)
This encounter was created in error - please disregard.

## 2021-08-09 NOTE — Telephone Encounter (Signed)
The encounter on 08/06/21 at 9:47 was entered in error. The patient was called today and given her result. Patient was agreeable to her result. ?

## 2021-08-10 ENCOUNTER — Telehealth (INDEPENDENT_AMBULATORY_CARE_PROVIDER_SITE_OTHER): Payer: No Typology Code available for payment source | Admitting: Psychology

## 2021-08-10 ENCOUNTER — Encounter (INDEPENDENT_AMBULATORY_CARE_PROVIDER_SITE_OTHER): Payer: Self-pay

## 2021-08-10 ENCOUNTER — Telehealth (INDEPENDENT_AMBULATORY_CARE_PROVIDER_SITE_OTHER): Payer: Self-pay | Admitting: Psychology

## 2021-08-10 NOTE — Telephone Encounter (Signed)
?  Office: [phone]  /  Fax: [phone] ? ?Date of Call: August 10, 2021  ?Time of Call: 9:32am ?Duration of Call: ~3 minute(s) ?Provider: Lawerance Cruel, PsyD ? ?CONTENT: This provider called Kymberli to check-in as she did not present for today's MyChart Video Visit appointment at 9:30am. She explained [place] the beach and experiencing difficulty connecting. Katrenia opted to reschedule and acknowledged understanding the next available appointment would be in approximately four weeks. A brief check-in was conducted. She shared, "Things are going very well." She acknowledged understanding that the 1x no show fee waiver would be applied for today's appointment. No evidence or endorsement of any safety concerns. All questions/concerns addressed.  ? ?PLAN:  Laprecious is scheduled for an appointment on Sep 06, 2021 at 12:30pm via MyChart Video Visit. ? ? ? ?

## 2021-08-16 ENCOUNTER — Ambulatory Visit (INDEPENDENT_AMBULATORY_CARE_PROVIDER_SITE_OTHER): Payer: No Typology Code available for payment source | Admitting: Family Medicine

## 2021-08-19 ENCOUNTER — Other Ambulatory Visit: Payer: No Typology Code available for payment source

## 2021-08-19 ENCOUNTER — Ambulatory Visit
Admission: RE | Admit: 2021-08-19 | Discharge: 2021-08-19 | Disposition: A | Discharge status: Home / Self Care | Payer: No Typology Code available for payment source | Source: Ambulatory Visit | Attending: Obstetrics & Gynecology | Admitting: Obstetrics & Gynecology

## 2021-08-19 DIAGNOSIS — G4719 Other hypersomnia: Secondary | ICD-10-CM

## 2021-08-19 DIAGNOSIS — Z13820 Encounter for screening for osteoporosis: Secondary | ICD-10-CM

## 2021-08-19 DIAGNOSIS — R072 Precordial pain: Secondary | ICD-10-CM

## 2021-08-19 LAB — BASIC METABOLIC PANEL
BUN/Creatinine Ratio: 28 (ref 12–28)
BUN: 19 mg/dL (ref 8–27)
CO2: 24 mmol/L (ref 20–29)
Calcium: 9.3 mg/dL (ref 8.7–10.3)
Chloride: 102 mmol/L (ref 96–106)
Creatinine, Ser: 0.69 mg/dL (ref 0.57–1.00)
Glucose: 88 mg/dL (ref 70–99)
Potassium: 4.2 mmol/L (ref 3.5–5.2)
Sodium: 140 mmol/L (ref 134–144)
eGFR: 99 mL/min/{1.73_m2} (ref >59)

## 2021-08-23 ENCOUNTER — Encounter (INDEPENDENT_AMBULATORY_CARE_PROVIDER_SITE_OTHER): Payer: Self-pay | Admitting: Family Medicine

## 2021-08-23 ENCOUNTER — Encounter: Payer: Self-pay | Admitting: Cardiology

## 2021-08-23 DIAGNOSIS — R0789 Other chest pain: Secondary | ICD-10-CM

## 2021-08-23 DIAGNOSIS — R002 Palpitations: Secondary | ICD-10-CM

## 2021-08-23 NOTE — Progress Notes (Unsigned)
?  Office: [phone]  /  Fax: [phone] ? ? ? ?Date: 09/06/2021   ?Appointment Start Time: *** ?Duration: *** minutes ?Provider: Lawerance Cruel, Psy.D. ?Type of Session: Individual Therapy  ?Location of Patient: {gbptloc:23249} (private location) ?Location of Provider: Provider's Home (private office) ?Type of Contact: Telepsychological Visit via MyChart Video Visit ? ?Session Content: Angela Randolph is a 61 y.o. female presenting for a follow-up appointment to address the previously established treatment goal of increasing coping skills.Today's appointment was a telepsychological visit due to COVID-19. [name] provided verbal consent for today's telepsychological appointment and she is aware she is responsible for securing confidentiality on her end of the session. Prior to proceeding with today's appointment, [name] physical location at the time of this appointment was obtained as well a phone number she could be reached at in the event of technical difficulties. [name] and this provider participated in today's telepsychological service.  ? ?This provider conducted a brief check-in. *** [name] was receptive to today's appointment as evidenced by openness to sharing, responsiveness to feedback, and {gbreceptiveness:23401}. ? ?Mental Status Examination:  ?Appearance: {Appearance:22431} ?Behavior: {Behavior:22445} ?Mood: {gbmood:21757} ?Affect: {Affect:22436} ?Speech: {Speech:22432} ?Eye Contact: {Eye Contact:22433} ?Psychomotor Activity: {Motor Activity:22434} ?Gait: {gbgait:23404} ?Thought Process: {thought process:22448}  ?Thought Content/Perception: {disturbances:22451} ?Orientation: {Orientation:22437} ?Memory/Concentration: {gbcognition:22449} ?Insight: {Insight:22446} ?Judgment: {Insight:22446} ? ?Interventions:  ?{Interventions for Progress Notes:23405} ? ?DSM-5 Diagnosis(es):  F50.89 Other Specified Feeding or Eating Disorder, Emotional Eating Behaviors and F41.9 Unspecified Anxiety Disorder ? ?Treatment  Goal & Progress: During the initial appointment with this provider, the following treatment goal was established: increase coping skills. [name] has demonstrated progress in her goal as evidenced by {gbtxprogress:22839}. [name] also {gbtxprogress2:22951}. ? ?Plan: The next appointment is scheduled for *** at ***, which will be via MyChart Video Visit. The next session will focus on {Plan for Next Appointment:23400}. ? ?

## 2021-08-24 ENCOUNTER — Other Ambulatory Visit (INDEPENDENT_AMBULATORY_CARE_PROVIDER_SITE_OTHER): Payer: Self-pay | Admitting: Bariatrics

## 2021-08-24 DIAGNOSIS — E559 Vitamin D deficiency, unspecified: Secondary | ICD-10-CM

## 2021-08-24 MED ORDER — VITAMIN D (ERGOCALCIFEROL) 1.25 MG (50000 UNIT) PO CAPS: 50000.0000 [IU] | ORAL_CAPSULE | ORAL | 0 refills | Status: AC

## 2021-08-24 NOTE — Telephone Encounter (Signed)
Vitamin D ?LAST APPOINTMENT DATE: 07/28/21 ?NEXT APPOINTMENT DATE: 09/28/21 ? ? ?[facility] #[id number] - [place], [place] [address] ?[place] [address] ?Phone: [phone] Fax: [phone] ? ?[facility] #[id number] - [place], [place] - [address] ?[address] ?[facility] [place] [address] ?Phone: [phone] Fax: [phone] ? ?[facility] ?[address] ?[place] [address] ?Phone: [phone] Fax: [phone] ? ?Patient is requesting a refill of the following medications: ?No prescriptions requested or ordered in this encounter ? ? ?Date last filled: 07/12/21 ?Previously prescribed by Dr.Opalski ? ?Lab Results ?     Component                Value               Date                 ?     HGBA1C                   5.7 (H)             06/15/2021           ?     HGBA1C                   5.7                 03/24/2021           ?Lab Results ?     Component                Value               Date                 ?     LDLCALC                  115 (H)             06/15/2021           ?     CREATININE               0.69                08/19/2021           ?Lab Results ?     Component                Value               Date                 ?     VD25OH                   33.6                06/15/2021           ? ?BP Readings from Last 3 Encounters: ?07/28/21 : 110/66 ?07/14/21 : 110/68 ?07/12/21 : 131/80 ?

## 2021-08-27 ENCOUNTER — Telehealth (HOSPITAL_COMMUNITY): Payer: Self-pay | Admitting: *Deleted

## 2021-08-27 NOTE — Telephone Encounter (Signed)
Attempted to call patient regarding upcoming cardiac CT appointment. °Left message on voicemail with name and callback number ° °Alessio Bogan RN Navigator Cardiac Imaging °[facility] °[phone] Office °[phone] Cell ° °

## 2021-08-30 ENCOUNTER — Ambulatory Visit (HOSPITAL_COMMUNITY)
Admission: RE | Admit: 2021-08-30 | Discharge: 2021-08-30 | Disposition: A | Discharge status: Home / Self Care | Payer: No Typology Code available for payment source | Source: Ambulatory Visit | Attending: Cardiology | Admitting: Cardiology

## 2021-08-30 DIAGNOSIS — R072 Precordial pain: Secondary | ICD-10-CM

## 2021-08-30 MED ORDER — IOHEXOL 350 MG/ML SOLN
100.0000 mL | Freq: Once | INTRAVENOUS | Status: AC | PRN
  Administered 2021-08-30: 100 mL via INTRAVENOUS

## 2021-08-30 MED ORDER — NITROGLYCERIN 0.4 MG SL SUBL
SUBLINGUAL_TABLET | SUBLINGUAL | Status: AC
  Filled 2021-08-30: qty 2

## 2021-08-30 MED ORDER — NITROGLYCERIN 0.4 MG SL SUBL
0.8000 mg | SUBLINGUAL_TABLET | Freq: Once | SUBLINGUAL | Status: AC
  Administered 2021-08-30: 0.8 mg via SUBLINGUAL

## 2021-08-31 ENCOUNTER — Ambulatory Visit (INDEPENDENT_AMBULATORY_CARE_PROVIDER_SITE_OTHER): Payer: No Typology Code available for payment source | Admitting: Physician Assistant

## 2021-09-03 ENCOUNTER — Encounter: Payer: Self-pay | Admitting: Cardiology

## 2021-09-06 ENCOUNTER — Ambulatory Visit (INDEPENDENT_AMBULATORY_CARE_PROVIDER_SITE_OTHER): Payer: No Typology Code available for payment source | Admitting: Physician Assistant

## 2021-09-06 ENCOUNTER — Telehealth (INDEPENDENT_AMBULATORY_CARE_PROVIDER_SITE_OTHER): Payer: No Typology Code available for payment source | Admitting: Psychology

## 2021-09-20 ENCOUNTER — Other Ambulatory Visit (INDEPENDENT_AMBULATORY_CARE_PROVIDER_SITE_OTHER): Payer: Self-pay | Admitting: Bariatrics

## 2021-09-20 DIAGNOSIS — E559 Vitamin D deficiency, unspecified: Secondary | ICD-10-CM

## 2021-09-28 ENCOUNTER — Ambulatory Visit (INDEPENDENT_AMBULATORY_CARE_PROVIDER_SITE_OTHER): Payer: No Typology Code available for payment source | Admitting: Family Medicine

## 2021-09-29 ENCOUNTER — Telehealth (INDEPENDENT_AMBULATORY_CARE_PROVIDER_SITE_OTHER): Payer: Self-pay

## 2021-09-29 NOTE — Telephone Encounter (Signed)
Margaret from [facility] [employer] [facility] called for the patient the labs that were drawn on 06/15/21 were denied and [facility] will need a diagnosis code. Can call them back at [phone]. Joycelyn Schmid states she was going to fax over a paper as well.

## 2021-10-04 NOTE — Telephone Encounter (Signed)
Angela Randolph 

## 2021-10-06 NOTE — Telephone Encounter (Signed)
Attempted to call [facility].  Was on hold for over 30 minutes.  Unable to speak with anyone. Had to room my next patient.

## 2021-10-07 ENCOUNTER — Encounter: Payer: Self-pay | Admitting: Cardiology

## 2021-10-19 ENCOUNTER — Ambulatory Visit (INDEPENDENT_AMBULATORY_CARE_PROVIDER_SITE_OTHER): Payer: No Typology Code available for payment source

## 2021-10-19 DIAGNOSIS — R0789 Other chest pain: Secondary | ICD-10-CM

## 2021-10-19 DIAGNOSIS — R002 Palpitations: Secondary | ICD-10-CM

## 2021-10-29 ENCOUNTER — Other Ambulatory Visit (INDEPENDENT_AMBULATORY_CARE_PROVIDER_SITE_OTHER): Payer: Self-pay | Admitting: Bariatrics

## 2021-10-29 DIAGNOSIS — E559 Vitamin D deficiency, unspecified: Secondary | ICD-10-CM

## 2021-11-29 ENCOUNTER — Encounter: Payer: Self-pay | Admitting: Cardiology

## 2021-12-01 ENCOUNTER — Encounter (INDEPENDENT_AMBULATORY_CARE_PROVIDER_SITE_OTHER): Payer: Self-pay

## 2022-05-23 ENCOUNTER — Other Ambulatory Visit: Payer: Self-pay | Admitting: Gastroenterology

## 2022-05-23 DIAGNOSIS — Z800 Family history of malignant neoplasm of digestive organs: Secondary | ICD-10-CM

## 2022-06-13 ENCOUNTER — Other Ambulatory Visit: Payer: Self-pay | Admitting: Obstetrics & Gynecology

## 2022-06-13 DIAGNOSIS — Z1231 Encounter for screening mammogram for malignant neoplasm of breast: Secondary | ICD-10-CM

## 2022-06-14 ENCOUNTER — Ambulatory Visit
Admission: RE | Admit: 2022-06-14 | Discharge: 2022-06-14 | Disposition: A | Discharge status: Home / Self Care | Payer: BC Managed Care – PPO | Source: Ambulatory Visit | Attending: Gastroenterology | Admitting: Gastroenterology

## 2022-06-14 DIAGNOSIS — Z800 Family history of malignant neoplasm of digestive organs: Secondary | ICD-10-CM

## 2022-06-28 ENCOUNTER — Ambulatory Visit
Admission: RE | Admit: 2022-06-28 | Discharge: 2022-06-28 | Disposition: A | Discharge status: Home / Self Care | Payer: No Typology Code available for payment source | Source: Ambulatory Visit | Attending: Obstetrics & Gynecology | Admitting: Obstetrics & Gynecology

## 2022-06-28 DIAGNOSIS — Z1231 Encounter for screening mammogram for malignant neoplasm of breast: Secondary | ICD-10-CM

## 2023-06-01 ENCOUNTER — Other Ambulatory Visit: Payer: Self-pay | Admitting: Obstetrics & Gynecology

## 2023-06-01 DIAGNOSIS — Z1231 Encounter for screening mammogram for malignant neoplasm of breast: Secondary | ICD-10-CM

## 2023-06-29 ENCOUNTER — Ambulatory Visit: Payer: No Typology Code available for payment source

## 2023-07-07 ENCOUNTER — Ambulatory Visit
Admission: RE | Admit: 2023-07-07 | Discharge: 2023-07-07 | Disposition: A | Discharge status: Home / Self Care | Source: Ambulatory Visit | Attending: Obstetrics & Gynecology | Admitting: Obstetrics & Gynecology

## 2023-07-07 DIAGNOSIS — Z1231 Encounter for screening mammogram for malignant neoplasm of breast: Secondary | ICD-10-CM

## 2024-06-17 ENCOUNTER — Ambulatory Visit: Admitting: Physician Assistant
